# Patient Record
Sex: Female | Born: 1990 | Race: White | Hispanic: No | Marital: Single | Smoking: Never smoker
Health system: Southern US, Community
[De-identification: ages and names within clinical notes are randomized; demographics above are authoritative.]

## PROBLEM LIST (undated history)

## (undated) DIAGNOSIS — F909 Attention-deficit hyperactivity disorder, unspecified type: Secondary | ICD-10-CM

## (undated) DIAGNOSIS — F419 Anxiety disorder, unspecified: Secondary | ICD-10-CM

## (undated) DIAGNOSIS — F32A Depression, unspecified: Secondary | ICD-10-CM

## (undated) DIAGNOSIS — F319 Bipolar disorder, unspecified: Secondary | ICD-10-CM

## (undated) HISTORY — PX: DILATION AND CURETTAGE OF UTERUS: SHX78

---

## 2012-10-22 ENCOUNTER — Encounter (HOSPITAL_BASED_OUTPATIENT_CLINIC_OR_DEPARTMENT_OTHER): Payer: Self-pay | Admitting: *Deleted

## 2012-10-22 ENCOUNTER — Emergency Department (HOSPITAL_BASED_OUTPATIENT_CLINIC_OR_DEPARTMENT_OTHER)
Admission: EM | Admit: 2012-10-22 | Discharge: 2012-10-22 | Disposition: A | Discharge status: Home / Self Care | Payer: Medicaid Other | Attending: Emergency Medicine | Admitting: Emergency Medicine

## 2012-10-22 DIAGNOSIS — J039 Acute tonsillitis, unspecified: Secondary | ICD-10-CM | POA: Insufficient documentation

## 2012-10-22 DIAGNOSIS — Z3202 Encounter for pregnancy test, result negative: Secondary | ICD-10-CM | POA: Insufficient documentation

## 2012-10-22 LAB — URINALYSIS, ROUTINE W REFLEX MICROSCOPIC
Bilirubin Urine: NEGATIVE
Glucose, UA: NEGATIVE mg/dL
Hgb urine dipstick: NEGATIVE
Ketones, ur: NEGATIVE mg/dL
Protein, ur: NEGATIVE mg/dL
pH: 7.5 (ref 5.0–8.0)

## 2012-10-22 LAB — URINE MICROSCOPIC-ADD ON

## 2012-10-22 MED ORDER — HYDROCODONE-ACETAMINOPHEN 5-325 MG PO TABS: 2.0000 | ORAL_TABLET | ORAL | Status: DC | PRN

## 2012-10-22 MED ORDER — CLINDAMYCIN HCL 150 MG PO CAPS: 300.0000 mg | ORAL_CAPSULE | Freq: Four times a day (QID) | ORAL | Status: DC

## 2012-10-22 NOTE — ED Provider Notes (Signed)
Medical screening examination/treatment/procedure(s) were performed by non-physician practitioner and as supervising physician I was immediately available for consultation/collaboration.    Nelia Shi, MD 10/22/12 [phone]

## 2012-10-22 NOTE — ED Notes (Signed)
Encouraged Pt. She will be seen by a EDP soon.

## 2012-10-22 NOTE — ED Provider Notes (Signed)
History     CSN: [id number]  Arrival date & time 10/22/12  1416   First MD Initiated Contact with Patient 10/22/12 1531      Chief Complaint  Patient presents with  . Sore Throat    (Consider location/radiation/quality/duration/timing/severity/associated sxs/prior treatment) Patient is a 22 y.o. female presenting with pharyngitis. The history is provided by the patient. No language interpreter was used.  Sore Throat This is a new problem. The current episode started yesterday. The problem occurs constantly. The problem has been gradually worsening. Associated symptoms include a sore throat. Nothing aggravates the symptoms. The treatment provided moderate relief.   Pt reports she has had a cough for several weeks and now has a bad sore throat and an ear ache History reviewed. No pertinent past medical history.  History reviewed. No pertinent past surgical history.  No family history on file.  History  Substance Use Topics  . Smoking status: Never Smoker   . Smokeless tobacco: Not on file  . Alcohol Use: Yes    OB History    Grav Para Term Preterm Abortions TAB SAB Ect Mult Living                  Review of Systems  HENT: Positive for sore throat.   All other systems reviewed and are negative.    Allergies  Amoxicillin  Home Medications  No current outpatient prescriptions on file.  BP 111/69  Pulse 79  Temp 98.3 F (36.8 C) (Oral)  Resp 20  SpO2 100%  LMP 09/21/2012  Physical Exam  Nursing note and vitals reviewed. Constitutional: She appears well-developed and well-nourished.  HENT:  Head: Normocephalic and atraumatic.  Right Ear: External ear normal.       Exudate left tonsil  Swelling bilat  Eyes: Conjunctivae normal and EOM are normal. Pupils are equal, round, and reactive to light.  Neck: Normal range of motion. Neck supple.  Cardiovascular: Normal rate.   Pulmonary/Chest: Effort normal.  Abdominal: Soft.  Musculoskeletal: Normal range of  motion.  Neurological: She is alert.  Skin: Skin is warm.  Psychiatric: She has a normal mood and affect.    ED Course  Procedures (including critical care time)  Labs Reviewed  URINALYSIS, ROUTINE W REFLEX MICROSCOPIC - Abnormal; Notable for the following:    APPearance CLOUDY (*)     Leukocytes, UA MODERATE (*)     All other components within normal limits  URINE MICROSCOPIC-ADD ON - Abnormal; Notable for the following:    Squamous Epithelial / LPF FEW (*)     Bacteria, UA FEW (*)     All other components within normal limits  PREGNANCY, URINE  RAPID STREP SCREEN  URINE CULTURE   No results found.   1. Tonsillitis       MDM  Clindamycin and hydrocodone        Lonia Skinner [place], [place] 10/22/12 1618  Lonia Skinner [place], [place] 10/22/12 [phone]

## 2012-10-22 NOTE — ED Notes (Signed)
Sore throat and left ear pain since last pm.

## 2012-10-23 LAB — URINE CULTURE

## 2013-04-14 ENCOUNTER — Encounter (HOSPITAL_BASED_OUTPATIENT_CLINIC_OR_DEPARTMENT_OTHER): Payer: Self-pay | Admitting: Emergency Medicine

## 2013-04-14 ENCOUNTER — Emergency Department (HOSPITAL_BASED_OUTPATIENT_CLINIC_OR_DEPARTMENT_OTHER)
Admission: EM | Admit: 2013-04-14 | Discharge: 2013-04-14 | Disposition: A | Discharge status: Home Health | Payer: Medicaid Other | Attending: Emergency Medicine | Admitting: Emergency Medicine

## 2013-04-14 DIAGNOSIS — Y92009 Unspecified place in unspecified non-institutional (private) residence as the place of occurrence of the external cause: Secondary | ICD-10-CM | POA: Insufficient documentation

## 2013-04-14 DIAGNOSIS — Z880 Allergy status to penicillin: Secondary | ICD-10-CM | POA: Insufficient documentation

## 2013-04-14 DIAGNOSIS — T754XXA Electrocution, initial encounter: Secondary | ICD-10-CM | POA: Insufficient documentation

## 2013-04-14 DIAGNOSIS — W868XXA Exposure to other electric current, initial encounter: Secondary | ICD-10-CM | POA: Insufficient documentation

## 2013-04-14 DIAGNOSIS — IMO0002 Reserved for concepts with insufficient information to code with codable children: Secondary | ICD-10-CM

## 2013-04-14 DIAGNOSIS — Y939 Activity, unspecified: Secondary | ICD-10-CM | POA: Insufficient documentation

## 2013-04-14 MED ORDER — HYDROCODONE-ACETAMINOPHEN 5-325 MG PO TABS: 2.0000 | ORAL_TABLET | ORAL | Status: DC | PRN
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
## 2013-04-15 NOTE — ED Provider Notes (Signed)
Medical screening examination/treatment/procedure(s) were performed by non-physician practitioner and as supervising physician I was immediately available for consultation/collaboration.   Gwyneth Sprout, MD 04/15/13 [phone]

## 2014-06-03 ENCOUNTER — Emergency Department (HOSPITAL_BASED_OUTPATIENT_CLINIC_OR_DEPARTMENT_OTHER)
Admission: EM | Admit: 2014-06-03 | Discharge: 2014-06-03 | Disposition: A | Discharge status: Home / Self Care | Payer: Medicaid Other | Attending: Emergency Medicine | Admitting: Emergency Medicine

## 2014-06-03 DIAGNOSIS — Z3202 Encounter for pregnancy test, result negative: Secondary | ICD-10-CM | POA: Insufficient documentation

## 2014-06-03 DIAGNOSIS — Z880 Allergy status to penicillin: Secondary | ICD-10-CM | POA: Insufficient documentation

## 2014-06-03 DIAGNOSIS — R197 Diarrhea, unspecified: Secondary | ICD-10-CM | POA: Insufficient documentation

## 2014-06-03 DIAGNOSIS — Z9889 Other specified postprocedural states: Secondary | ICD-10-CM | POA: Insufficient documentation

## 2014-06-03 DIAGNOSIS — Z792 Long term (current) use of antibiotics: Secondary | ICD-10-CM | POA: Diagnosis not present

## 2014-06-03 DIAGNOSIS — R112 Nausea with vomiting, unspecified: Secondary | ICD-10-CM | POA: Diagnosis present

## 2014-06-03 DIAGNOSIS — Z79899 Other long term (current) drug therapy: Secondary | ICD-10-CM | POA: Insufficient documentation

## 2014-06-03 LAB — CBC
HCT: 37.7 % (ref 36.0–46.0)
HEMOGLOBIN: 12.9 g/dL (ref 12.0–15.0)
MCH: 30.6 pg (ref 26.0–34.0)
MCHC: 34.2 g/dL (ref 30.0–36.0)
MCV: 89.3 fL (ref 78.0–100.0)
PLATELETS: 231 10*3/uL (ref 150–400)
RBC: 4.22 MIL/uL (ref 3.87–5.11)
RDW: 13.6 % (ref 11.5–15.5)
WBC: 9.2 10*3/uL (ref 4.0–10.5)

## 2014-06-03 LAB — COMPREHENSIVE METABOLIC PANEL
ALT: 9 U/L (ref 0–35)
ANION GAP: 14 (ref 5–15)
AST: 11 U/L (ref 0–37)
Albumin: 3.9 g/dL (ref 3.5–5.2)
Alkaline Phosphatase: 35 U/L — ABNORMAL LOW (ref 39–117)
BILIRUBIN TOTAL: 0.4 mg/dL (ref 0.3–1.2)
BUN: 6 mg/dL (ref 6–23)
CHLORIDE: 104 meq/L (ref 96–112)
CO2: 25 meq/L (ref 19–32)
CREATININE: 0.7 mg/dL (ref 0.50–1.10)
Calcium: 9.8 mg/dL (ref 8.4–10.5)
Glucose, Bld: 101 mg/dL — ABNORMAL HIGH (ref 70–99)
Potassium: 3.3 mEq/L — ABNORMAL LOW (ref 3.7–5.3)
Sodium: 143 mEq/L (ref 137–147)
Total Protein: 7.3 g/dL (ref 6.0–8.3)

## 2014-06-03 LAB — URINALYSIS, ROUTINE W REFLEX MICROSCOPIC
BILIRUBIN URINE: NEGATIVE
Glucose, UA: NEGATIVE mg/dL
Hgb urine dipstick: NEGATIVE
KETONES UR: NEGATIVE mg/dL
Leukocytes, UA: NEGATIVE
NITRITE: NEGATIVE
PH: 7 (ref 5.0–8.0)
Protein, ur: NEGATIVE mg/dL
Specific Gravity, Urine: 1.012 (ref 1.005–1.030)
Urobilinogen, UA: 0.2 mg/dL (ref 0.0–1.0)

## 2014-06-03 LAB — LIPASE, BLOOD: Lipase: 32 U/L (ref 11–59)

## 2014-06-03 LAB — PREGNANCY, URINE: Preg Test, Ur: NEGATIVE

## 2014-06-03 MED ORDER — ONDANSETRON HCL 4 MG/2ML IJ SOLN
4.0000 mg | Freq: Once | INTRAMUSCULAR | Status: AC
  Administered 2014-06-03: 4 mg via INTRAVENOUS
  Filled 2014-06-03: qty 2

## 2014-06-03 MED ORDER — ONDANSETRON HCL 4 MG PO TABS: 4.0000 mg | ORAL_TABLET | Freq: Four times a day (QID) | ORAL | Status: DC

## 2014-06-03 MED ORDER — SODIUM CHLORIDE 0.9 % IV BOLUS (SEPSIS)
1000.0000 mL | Freq: Once | INTRAVENOUS | Status: AC
  Administered 2014-06-03: 1000 mL via INTRAVENOUS

## 2014-06-03 NOTE — ED Provider Notes (Signed)
CSN: [id number]     Arrival date & time 06/03/14  1014 History   First MD Initiated Contact with Patient 06/03/14 1037     No chief complaint on file.    (Consider location/radiation/quality/duration/timing/severity/associated sxs/prior Treatment) HPI Pt presenting with c/o nausea, vomiting, diarrhea which began several days ago, worse today.  She states that her primary concern is that she is afraid she is pregnant.  LMP was 2 weeks ago and normal.  No abdominal pain. No fever/chills.  She has no known sick contacts.  No recent travel.  She has tried drinking liquids but states this causes dry heaves.  There are no other associated systemic symptoms, there are no other alleviating or modifying factors.   No past medical history on file. Past Surgical History  Procedure Laterality Date  . Dilation and curettage of uterus     No family history on file. History  Substance Use Topics  . Smoking status: Never Smoker   . Smokeless tobacco: Not on file  . Alcohol Use: Yes   OB History   Grav Para Term Preterm Abortions TAB SAB Ect Mult Living                 Review of Systems ROS reviewed and all otherwise negative except for mentioned in HPI    Allergies  Amoxicillin  Home Medications   Prior to Admission medications   Medication Sig Start Date End Date Taking? Authorizing Provider  clindamycin (CLEOCIN) 150 MG capsule Take 2 capsules (300 mg total) by mouth every 6 (six) hours. 10/22/12   Elson Areas, PA-C  HYDROcodone-acetaminophen (NORCO/VICODIN) 5-325 MG per tablet Take 2 tablets by mouth every 4 (four) hours as needed for pain. 10/22/12   Elson Areas, PA-C  HYDROcodone-acetaminophen (NORCO/VICODIN) 5-325 MG per tablet Take 2 tablets by mouth every 4 (four) hours as needed for pain. 04/14/13   Elson Areas, PA-C  ondansetron (ZOFRAN) 4 MG tablet Take 1 tablet (4 mg total) by mouth every 6 (six) hours. 06/03/14   Ethelda Chick, MD   BP 106/70  Pulse 52  Temp(Src) 97.7  F (36.5 C) (Oral)  Resp 17  Ht  (1.753 m)  Wt 200 lb (90.719 kg)  BMI 29.52 kg/m2  SpO2 100% Vitals reviewed Physical Exam Physical Examination: General appearance - alert, well appearing, and in no distress Mental status - alert, oriented to person, place, and time Eyes - no scleral icterus, no conjunctival injection Mouth - mucous membranes moist, pharynx normal without lesions Chest - clear to auscultation, no wheezes, rales or rhonchi, symmetric air entry Heart - normal rate, regular rhythm, normal S1, S2, no murmurs, rubs, clicks or gallops Abdomen - soft, nontender, nondistended, no masses or organomegaly, nabs Extremities - peripheral pulses normal, no pedal edema, no clubbing or cyanosis Skin - normal coloration and turgor, no rashes  ED Course  Procedures (including critical care time)  12:54 PM pt has been able to keep down liquids in the ED.  Labs Review Labs Reviewed  COMPREHENSIVE METABOLIC PANEL - Abnormal; Notable for the following:    Potassium 3.3 (*)    Glucose, Bld 101 (*)    Alkaline Phosphatase 35 (*)    All other components within normal limits  URINALYSIS, ROUTINE W REFLEX MICROSCOPIC  PREGNANCY, URINE  CBC  LIPASE, BLOOD    Imaging Review No results found.   EKG Interpretation None      MDM   Final diagnoses:  Nausea vomiting and  diarrhea    Pt presenting with c/o nasuea, vomiting, diarrhea. UA reassuring. Pt feels improved after IV fluids and zofran.  She has tolerated po trial. Discharged with strict return precautions.  Pt agreeable with plan.    Ethelda Chick, MD 06/05/14 2052

## 2014-06-03 NOTE — Discharge Instructions (Signed)
Return to the ED with any concerns including vomiting and not able to keep down liquids, abdominal pain- especially if it localizes to the right lower abdomen, fever/chills, decreased level of alertness/lethargy, or any other alarming symptoms

## 2014-06-03 NOTE — ED Notes (Signed)
Drinking sprite w/o nausea

## 2014-07-21 ENCOUNTER — Emergency Department (HOSPITAL_BASED_OUTPATIENT_CLINIC_OR_DEPARTMENT_OTHER)
Admission: EM | Admit: 2014-07-21 | Discharge: 2014-07-21 | Disposition: A | Discharge status: Home / Self Care | Payer: Medicaid Other | Attending: Emergency Medicine | Admitting: Emergency Medicine

## 2014-07-21 ENCOUNTER — Encounter (HOSPITAL_BASED_OUTPATIENT_CLINIC_OR_DEPARTMENT_OTHER): Payer: Self-pay | Admitting: Emergency Medicine

## 2014-07-21 DIAGNOSIS — K0889 Other specified disorders of teeth and supporting structures: Secondary | ICD-10-CM

## 2014-07-21 DIAGNOSIS — Z3202 Encounter for pregnancy test, result negative: Secondary | ICD-10-CM | POA: Diagnosis not present

## 2014-07-21 DIAGNOSIS — K0381 Cracked tooth: Secondary | ICD-10-CM | POA: Insufficient documentation

## 2014-07-21 DIAGNOSIS — Z880 Allergy status to penicillin: Secondary | ICD-10-CM | POA: Insufficient documentation

## 2014-07-21 DIAGNOSIS — K088 Other specified disorders of teeth and supporting structures: Secondary | ICD-10-CM | POA: Insufficient documentation

## 2014-07-21 LAB — PREGNANCY, URINE: PREG TEST UR: NEGATIVE

## 2014-07-21 MED ORDER — HYDROCODONE-ACETAMINOPHEN 5-325 MG PO TABS: 1.0000 | ORAL_TABLET | ORAL | Status: DC | PRN

## 2014-07-21 MED ORDER — CLINDAMYCIN HCL 150 MG PO CAPS: 450.0000 mg | ORAL_CAPSULE | Freq: Three times a day (TID) | ORAL | Status: AC

## 2014-07-21 NOTE — ED Provider Notes (Signed)
CSN: [id number]     Arrival date & time 07/21/14  1749 History   First MD Initiated Contact with Patient 07/21/14 1816     Chief Complaint  Patient presents with  . Dental Pain   (Consider location/radiation/quality/duration/timing/severity/associated sxs/prior Treatment) HPI Christina Arnold is a 23 yo female presenting with tooth pain x 1 week.  She has a tooth on her right lower jaw that recently broke that she reports is painful.  She reports 10/10 pain.  Pt also states she would also like someone to be able to prescribe medicines for her moods because sometime she loses her temper and gets angry at people.  She denies being angry currently and denies any thoughts of hurting herself or others.  She also reports she doe not know when her LMP was but states it is late.  She denies fevers, nausea, vomiting or headaches.       History reviewed. No pertinent past medical history. Past Surgical History  Procedure Laterality Date  . Dilation and curettage of uterus     History reviewed. No pertinent family history. History  Substance Use Topics  . Smoking status: Never Smoker   . Smokeless tobacco: Not on file  . Alcohol Use: Yes   OB History   Grav Para Term Preterm Abortions TAB SAB Ect Mult Living                 Review of Systems  Constitutional: Negative for fever and chills.  HENT: Negative for sore throat.   Eyes: Negative for visual disturbance.  Respiratory: Negative for cough and shortness of breath.   Cardiovascular: Negative for chest pain and leg swelling.  Gastrointestinal: Negative for nausea, vomiting and diarrhea.  Genitourinary: Negative for dysuria.  Musculoskeletal: Negative for myalgias.  Skin: Negative for rash.  Neurological: Negative for weakness, numbness and headaches.     Allergies  Amoxicillin  Home Medications   Prior to Admission medications   Medication Sig Start Date End Date Taking? Authorizing Provider  clindamycin (CLEOCIN) 150 MG  capsule Take 2 capsules (300 mg total) by mouth every 6 (six) hours. 10/22/12   Elson AreasLeslie K Sofia, PA-C  HYDROcodone-acetaminophen (NORCO/VICODIN) 5-325 MG per tablet Take 2 tablets by mouth every 4 (four) hours as needed for pain. 10/22/12   Elson AreasLeslie K Sofia, PA-C  HYDROcodone-acetaminophen (NORCO/VICODIN) 5-325 MG per tablet Take 2 tablets by mouth every 4 (four) hours as needed for pain. 04/14/13   Elson AreasLeslie K Sofia, PA-C  ondansetron (ZOFRAN) 4 MG tablet Take 1 tablet (4 mg total) by mouth every 6 (six) hours. 06/03/14   Ethelda ChickMartha K Linker, MD   BP 110/58  Pulse 87  Temp(Src) 98.9 F (37.2 C) (Oral)  Resp 18  Ht 5\' 9"  (1.753 m)  Wt 195 lb (88.451 kg)  BMI 28.78 kg/m2  SpO2 100%  LMP 07/21/2014 Physical Exam  Nursing note and vitals reviewed. Constitutional: She appears well-developed and well-nourished. No distress.  HENT:  Head: Normocephalic and atraumatic.  Mouth/Throat: Oropharynx is clear and moist. No oropharyngeal exudate.  Eyes: Conjunctivae are normal.  Neck: Neck supple. No thyromegaly present.  Cardiovascular: Normal rate, regular rhythm and intact distal pulses.   Pulmonary/Chest: Effort normal and breath sounds normal. No respiratory distress. She has no wheezes. She has no rales. She exhibits no tenderness.  Abdominal: Soft. There is no tenderness.  Musculoskeletal: She exhibits no tenderness.  Lymphadenopathy:    She has no cervical adenopathy.  Neurological: She is alert.  Skin: Skin is warm  and dry. No rash noted. She is not diaphoretic.  Psychiatric: She has a normal mood and affect.    ED Course  Procedures (including critical care time) Labs Review Labs Reviewed - No data to display  Imaging Review No results found.   EKG Interpretation None      MDM   Final diagnoses:  Pain, dental   23 yo female  with toothache.  No gross abscess.  Exam unconcerning for Ludwig's angina or spread of infection.  Will treat with clindamycin and pain medicine.  Her urine is  negative for pregnancy and she has been given resources for establishing care with a PCP for eval of her moods and recommended patient to follow-up with dentist.    Ceasar MonsFiled Vitals:   07/21/14 1817  BP: 110/58  Pulse: 87  Temp: 98.9 F (37.2 C)  TempSrc: Oral  Resp: 18  Height: 5\' 9"  (1.753 m)  Weight: 195 lb (88.451 kg)  SpO2: 100%   Meds given in ED:  Medications - No data to display  Discharge Medication List as of 07/21/2014  7:02 PM    START taking these medications   Details  !! clindamycin (CLEOCIN) 150 MG capsule Take 3 capsules (450 mg total) by mouth 3 (three) times daily., Starting 07/21/2014, Last dose on Fri 07/31/14, Print    !! HYDROcodone-acetaminophen (NORCO/VICODIN) 5-325 MG per tablet Take 1-2 tablets by mouth every 4 (four) hours as needed for moderate pain or severe pain., Starting 07/21/2014, Until Discontinued, Print     !! - Potential duplicate medications found. Please discuss with provider.        Harle BattiestElizabeth Itzamar Traynor, NP 07/25/14 1912

## 2014-07-21 NOTE — ED Notes (Addendum)
Pt c/o dental pain x 1 week pt also reports LMP late ? preg

## 2014-07-21 NOTE — Discharge Instructions (Signed)
Please follow the directions provided. Be sure to establish care with a dentist provided in the resource guide. You may take the antibiotic as prescribed. Referral is provided for primary care physician to help manage concerns regarding her moods. You may take ibuprofen 400 mg by mouth every 6 hours for pain or a Vicodin tablet for pain not relieved by the ibuprofen. Don't hesitate to return for new, worsening, or concerning symptoms.  SEEK IMMEDIATE MEDICAL CARE IF:  You have a fever.  You develop redness and swelling of your face, jaw, or neck.  You are unable to open your mouth.  You have severe pain uncontrolled by pain medicine   Emergency Department Resource Guide 1) Find a Doctor and Pay Out of Pocket Although you won't have to find out who is covered by your insurance plan, it is a good idea to ask around and get recommendations. You will then need to call the office and see if the doctor you have chosen will accept you as a new patient and what types of options they offer for patients who are self-pay. Some doctors offer discounts or will set up payment plans for their patients who do not have insurance, but you will need to ask so you aren't surprised when you get to your appointment.  2) Contact Your Local Health Department Not all health departments have doctors that can see patients for sick visits, but many do, so it is worth a call to see if yours does. If you don't know where your local health department is, you can check in your phone book. The CDC also has a tool to help you locate your state's health department, and many state websites also have listings of all of their local health departments.  3) Find a Walk-in Clinic If your illness is not likely to be very severe or complicated, you may want to try a walk in clinic. These are popping up all over the country in pharmacies, drugstores, and shopping centers. They're usually staffed by nurse practitioners or physician assistants  that have been trained to treat common illnesses and complaints. They're usually fairly quick and inexpensive. However, if you have serious medical issues or chronic medical problems, these are probably not your best option.  No Primary Care Doctor: - Call Health Connect at  [phone] - they can help you locate a primary care doctor that  accepts your insurance, provides certain services, etc. - Physician Referral Service- [phone]  Chronic Pain Problems: Organization         Address  Phone   Notes  [facility]  [phone] Patients need to be referred by their primary care doctor.   Medication Assistance: Organization         Address  Phone   Notes  [facility] [address] [place], [place] [address] [phone] --Must be a resident of [facility] -- Must have NO insurance coverage whatsoever (no Medicaid/ Medicare, etc.) -- The pt. MUST have a primary care doctor that directs their care regularly and follows them in the community   [facility]  [phone]   [employer]  [phone]    Agencies that provide inexpensive medical care: Organization         Address  Phone   Notes  [facility]  [phone]   [facility]    [phone]   [facility] [address]  [place], [place] [address] [phone]   [facility] [address], [place] [phone]) [phone]   Planned Parenthood    [phone]) [phone]   [facility]    [phone]   [facility] and [facility]  [address], [place] Phone:  [phone](336) [phone], Fax:  [phone](336) [phone] Hours of Operation:  9 am - 6 pm, M-F.  Also accepts Medicaid/Medicare and self-pay.  [facility]  [address], [place] Phone: [phone]) [phone], Fax: [phone]) [phone]. Hours of Operation:  8:30 am - 5:30 pm, M-F.  Also accepts Medicaid  and self-pay.  [facility] [place] [address], [place] Phone: [phone](336) [phone]   [facility] [address], [place], [place] [phone], Ext. [phone] Mondays & Thursdays: 7-9 AM.  First 15 patients are seen on a first come, first serve basis.    Medicaid-accepting [facility] Providers:  Organization         Address  Phone   Notes  [facility] [address], [place] [phone]) [phone] Also accepts self-pay patients.  [facility] [address], [place]  [phone]) [phone]   [facility] [address], [address], [place] [phone]) [phone]   [facility] [address] [place] [address], [place] [phone]) [phone]   Renaye RakersVeita Bland [address], [place]   [phone](336) [phone] Only accepts [place] Access [place]icaid patients after they have their name applied to their card.   Self-Pay (no insurance) in [facility]:  Organization         Address  Phone   Notes  Sickle Cell Patients, [facility] [address], [place] [phone]) [phone]   [facility] [address], [place] [phone]) [phone]   [facility]  [address], [place] [phone]) [phone]   [facility]/Dr. Osei-Bonsu  [address] [place] [address], [place] or [id number], [place] [phone]) [phone] Phone number for both [place] and [place] locations is the same.  [facility] [address], [place] [phone]) [phone]   [facility] [address] [place] [address], [place] or [address] [phone]) [phone]   [facility] [address], [place] [phone]) [phone], phone; [phone]) [phone], fax Sees patients 1st and 3rd Saturday of every month.  Must not qualify for public or private insurance (i.e. Medicaid, Medicare, Day Health Choice, Veterans' Benefits)  Household income  should be no more than 200% of the poverty level The clinic cannot treat you if you are pregnant or think you are pregnant  Sexually transmitted diseases are not treated at the clinic.    Dental Care: Organization         Address  Phone  Notes  [facility] of [facility] [address], [place] [phone]) [phone] Accepts children up to age 25 who are enrolled in [place]icaid or [place] Health Choice; pregnant women with a Medicaid card; and children who have applied for Medicaid or [place] Health Choice, but were declined, whose parents can pay a reduced fee at time of service.  [facility] of [facility] [place]  [address], [place] [phone]) [phone] Accepts children up to age 25 who are enrolled in [place]icaid or [place] Health Choice;  pregnant women with a Medicaid card; and children who have applied for Medicaid or [place] Health Choice, but were declined, whose parents can pay a reduced fee at time of service.  [facility] Access PRO[facility]  [place] [phone] Patients are seen by appointment only. Walk-ins are not accepted. [place] will see patients 15 years of age and older. Monday - Tuesday (8am-5pm) Most Wednesdays (8:30-5pm) $30 per visit, cash only  [facility] Adult Dental Access PROGRAM  [address], [facility] [phone] Patients are seen by appointment only. Walk-ins are not accepted. Welcome will see patients 36 years of age and older. One Wednesday Evening (Monthly: Volunteer Based).  $30 per visit, cash only  [place]  [phone] for adults; Children under age 94, call Graduate Pediatric Dentistry at [phone]. Children aged 40-14, please call [phone] to request a pediatric application.  Dental services are provided in all areas of dental care including fillings, crowns and bridges, complete and partial dentures, implants, gum treatment,  root canals, and extractions. Preventive care is also provided. Treatment is provided to both adults and children. Patients are selected via a lottery and there is often a waiting list.   [facility] [address], [place]  [phone] [email or web address]   [facility] [address] [place], [place] [phone], Ext. [phone] Second and Fourth Thursday of each month, opens at 6:30 AM; Clinic ends at 9 AM.  Patients are seen on a first-come first-served basis, and a limited number are seen during each clinic.   [facility]  [address] Hillard Danker [place], [place] [phone]   Eligibility Requirements You must have lived in [place], [place], or [place] counties for at least the last three months.   You cannot be eligible for state or federal sponsored [employer], including [employer], [place], or [employer].   You generally cannot be eligible for healthcare insurance through your employer.    How to apply: Eligibility screenings are held every Tuesday and Wednesday afternoon from 1:00 pm until 4:00 pm. You do not need an appointment for the interview!  [facility] [address], [place], [place]   [place]  [place]  [place]  [phone]    Behavioral Health Resources in the Community: Intensive Outpatient Programs Organization         Address  Phone  Notes  [place]. [address], [place], [place] [phone]   [facility] [address], [place], [place]   ADS: Alcohol & Drug S[facility]cs [address], [place], [place]   [place] [address]  [place], [place] or [phone]   Substance Abuse Resources Organization         Address  Phone  Notes  Alcohol [facility] Drug Services   [phone]   [place]  [phone]   The [place]   Chinita Pester  [phone]   Residential & Outpatient Substance Abuse Program  [phone]   Psychological Services Organization         Address  Phone  Notes  [facility]  [place]  [phone]   [facility] [address], [place] or [phone]    Mobile  Crisis Teams Organization         Address  Phone  Notes  [facility], Mobile Crisis Care Unit  [phone]   [facility]  [address] [place], [place] [phone]   [facility] [address] [place] [phone]    Self-Help/Support Groups Organization         Address  Phone             Notes  [facility]ental Health Assoc. of [place] - variety of support groups  336- [phone] Call for more information  Narcotics Anonymous (NA), [facility] [address], [place]  2 meetings at this location   Statisticianesidential Treatment Programs Organization         Address  Phone  Notes  [facility] [address],    [place]  [phone]   [facility]  [address], [place] [phone], [place], [place] [phone]   [facility] [address], [place] [phone] Admissions: 8am-3pm M-F  [facility] [address] [place] [address]    [place], [place] [phone]   [facility] [address], [place], [place] [phone]   [facility] [address]  [place], [place] [phone]   [facility] - Intensive Outpatient [address], [place], [place] [phone]   [facility] [address]  [place], [place] [phone] or [phone]   [facility] (RT[facility]) [address] [place], [place] [phone] Accepts Medicaid  [facility] [address]  [place] [phone] Substance Abuse/Addiction Treatment   [facility] Behavioral Health Resources Organization         Address  Phone  Notes  [facility]  [phone]) [phone]   Angie FavaJulie Brannon, PhD [address], [address] [place], [place]   [phone](336) [phone] or [phone](336) [phone]   [facility]   [address] [place], [place] [phone](336) [phone]   [facility] [address], [place], [place] [phone](336) [phone] Insurance/Medicaid/sponsorship through [facility]  [facility] Families [address]                                    [place], [place] [phone]) [phone] Therapy/tele-psych/case  [facility] [address]   [place], [place] [phone](336) [phone]    Dr. Lolly MustacheArfeen  [phone](336) [phone]   [facility] [place]  [employer] [facility] 1) [address], [place] 2) [address], [place] 3)  [address], [place] [phone](336) [phone](336) [phone]  [phone](336) [phone]   [facility] Child Abuse Hotline [phone]) [phone] or [phone](336) [phone] (After Hours)

## 2014-07-25 NOTE — ED Provider Notes (Signed)
Medical screening examination/treatment/procedure(s) were performed by non-physician practitioner and as supervising physician I was immediately available for consultation/collaboration.   EKG Interpretation None        Detrell Umscheid, MD 07/25/14 2335 

## 2014-08-07 ENCOUNTER — Encounter (HOSPITAL_BASED_OUTPATIENT_CLINIC_OR_DEPARTMENT_OTHER): Payer: Self-pay | Admitting: *Deleted

## 2014-08-07 ENCOUNTER — Emergency Department (HOSPITAL_BASED_OUTPATIENT_CLINIC_OR_DEPARTMENT_OTHER)
Admission: EM | Admit: 2014-08-07 | Discharge: 2014-08-07 | Disposition: A | Discharge status: Home / Self Care | Payer: Medicaid Other | Attending: Emergency Medicine | Admitting: Emergency Medicine

## 2014-08-07 DIAGNOSIS — Z79891 Long term (current) use of opiate analgesic: Secondary | ICD-10-CM | POA: Insufficient documentation

## 2014-08-07 DIAGNOSIS — M545 Low back pain, unspecified: Secondary | ICD-10-CM

## 2014-08-07 DIAGNOSIS — K1379 Other lesions of oral mucosa: Secondary | ICD-10-CM | POA: Insufficient documentation

## 2014-08-07 DIAGNOSIS — Z79899 Other long term (current) drug therapy: Secondary | ICD-10-CM | POA: Diagnosis not present

## 2014-08-07 DIAGNOSIS — Z880 Allergy status to penicillin: Secondary | ICD-10-CM | POA: Insufficient documentation

## 2014-08-07 DIAGNOSIS — Z3202 Encounter for pregnancy test, result negative: Secondary | ICD-10-CM | POA: Insufficient documentation

## 2014-08-07 DIAGNOSIS — R509 Fever, unspecified: Secondary | ICD-10-CM | POA: Diagnosis not present

## 2014-08-07 DIAGNOSIS — Z792 Long term (current) use of antibiotics: Secondary | ICD-10-CM | POA: Insufficient documentation

## 2014-08-07 DIAGNOSIS — K029 Dental caries, unspecified: Secondary | ICD-10-CM | POA: Diagnosis not present

## 2014-08-07 DIAGNOSIS — R002 Palpitations: Secondary | ICD-10-CM

## 2014-08-07 DIAGNOSIS — R220 Localized swelling, mass and lump, head: Secondary | ICD-10-CM

## 2014-08-07 LAB — URINALYSIS, ROUTINE W REFLEX MICROSCOPIC
BILIRUBIN URINE: NEGATIVE
Glucose, UA: NEGATIVE mg/dL
Hgb urine dipstick: NEGATIVE
KETONES UR: NEGATIVE mg/dL
Leukocytes, UA: NEGATIVE
NITRITE: NEGATIVE
Protein, ur: NEGATIVE mg/dL
Specific Gravity, Urine: 1.021 (ref 1.005–1.030)
UROBILINOGEN UA: 0.2 mg/dL (ref 0.0–1.0)
pH: 8.5 — ABNORMAL HIGH (ref 5.0–8.0)

## 2014-08-07 LAB — PREGNANCY, URINE: Preg Test, Ur: NEGATIVE

## 2014-08-07 MED ORDER — IBUPROFEN 800 MG PO TABS
800.0000 mg | ORAL_TABLET | Freq: Once | ORAL | Status: AC
  Administered 2014-08-07: 800 mg via ORAL
  Filled 2014-08-07: qty 1

## 2014-08-07 NOTE — Discharge Instructions (Signed)
Emergency Department Resource Guide 1) Find a Doctor and Pay Out of Pocket Although you won't have to find out who is covered by your insurance plan, it is a good idea to ask around and get recommendations. You will then need to call the office and see if the doctor you have chosen will accept you as a new patient and what types of options they offer for patients who are self-pay. Some doctors offer discounts or will set up payment plans for their patients who do not have insurance, but you will need to ask so you aren't surprised when you get to your appointment.  2) Contact Your Local Health Department Not all health departments have doctors that can see patients for sick visits, but many do, so it is worth a call to see if yours does. If you don't know where your local health department is, you can check in your phone book. The CDC also has a tool to help you locate your state's health department, and many state websites also have listings of all of their local health departments.  3) Find a [facility] If your illness is not likely to be very severe or complicated, you may want to try a walk in clinic. These are popping up all over the country in pharmacies, drugstores, and shopping centers. They're usually staffed by nurse practitioners or physician assistants that have been trained to treat common illnesses and complaints. They're usually fairly quick and inexpensive. However, if you have serious medical issues or chronic medical problems, these are probably not your best option.  No Primary Care Doctor: - Call Health Connect at  [phone] - they can help you locate a primary care doctor that  accepts your insurance, provides certain services, etc. - Physician Referral Service- [phone]  Chronic Pain Problems: Organization         Address  Phone   Notes  [facility]  [phone] Patients need to be referred by their primary care doctor.   Medication  Assistance: Organization         Address  Phone   Notes  [facility] Medication [facility] [address] [place], [place] [address] [phone] --Must be a resident of [place] [facility] -- Must have NO insurance coverage whatsoever (no Medicaid/ Medicare, etc.) -- The pt. MUST have a primary care doctor that directs their care regularly and follows them in the community   [facility]  [phone]   [employer]  [phone]    Agencies that provide inexpensive medical care: Organization         Address  Phone   Notes  [place]  [phone]   [facility]    [phone]   [facility] [place], [place] [address] [phone]   [place] [address] [place] [phone]   Planned Parenthood    [phone]   [facility]    [phone]   [place] [address], [place] Phone:  [phone], Fax:  [phone] Hours of Operation:  9 am - 6 pm, M-F.  Also accepts Medicaid/Medicare and self-pay.  [facility] for [place], [address],  Phone: [phone], Fax: [phone]. Hours of Operation:  8:30 am - 5:30 pm, M-F.  Also accepts Medicaid and self-pay.  [facility] [address], [place] Phone: [phone]   [place], [place], [place] [phone], Ext. [phone] Mondays & Thursdays: 7-9 AM.  First 15 patients are seen on a first come, first serve basis.    [place] Providers:  Organization         Address  Phone   Notes  [facility] [address], [place] [phone] Also accepts self-pay patients.  [facility] [address], [place]  [phone]   [place], [address], [place]  [phone]   [facility] [address], [place] [phone]   Lucianne Lei [address], [place]   [phone] Only accepts [place] Access [place] patients after they have their name applied to their card.   Self-Pay (no insurance) in [facility]:  Organization         Address  Phone   Notes  Sickle Cell Patients, [facility] [place] [phone]   [facility] [place] [phone]   [facility] [place]  [place], [place], [place] [phone]   [facility]/Dr. Osei-Bonsu  [address] [place] or [address], [place] [phone] Phone number for both [place] and [place] locations is the same.  [facility] [address] [place] [phone]   [facility] [address] [place] or [address] [phone]   [facility] [address], [place] [phone], phone; [phone], fax Sees patients 1st and 3rd Saturday of every month.  Must not qualify for public or private insurance (i.e. Medicaid, Medicare, [place] Health Choice, Veterans' Benefits)  Household income should be no more than 200% of the poverty level The clinic cannot treat you if you are pregnant or think you are pregnant  Sexually transmitted diseases are not treated at the clinic.    Dental Care: Organization         Address  Phone  Notes  [place] [facility] [place] [phone] Accepts children up to age 4 who are enrolled in [place] or [place]; pregnant women with a Medicaid card; and children who have applied for Medicaid or [place] Health Choice, but were declined, whose parents can pay a reduced fee at time of service.  [facility]  [facility]  [address], [place] [phone] Accepts children up to age 35 who are enrolled in [place] or [place]; pregnant women with a Medicaid card; and children who have applied for Medicaid or [place] Health Choice, but were declined, whose parents can pay a reduced fee at time of service.  [facility]  [place] [phone] Patients are seen by appointment only. Walk-ins are not accepted. [place] will see patients 43 years of age and older. Monday - Tuesday (8am-5pm) Most Wednesdays (8:30-5pm) $30 per visit, cash only  [facility] Adult [facility] Access PROGRAM  [address], [facility] [phone] Patients are seen by appointment only. Walk-ins are not accepted. [place] will see patients 40 years of age and older. One  Wednesday Evening (Monthly: Volunteer Based).  $30 per visit, cash only  °[facility]  [phone] for adults; Children under age 4, call Graduate Pediatric Dentistry at [phone]. Children aged 4-14, please call [phone] to request a pediatric application. ° Dental services are provided in all areas of dental care including fillings, crowns and bridges, complete and partial dentures, implants, gum treatment, root canals, and extractions. Preventive care is also provided. Treatment is provided to both adults and children. °Patients are selected via a lottery and there is often a waiting list. °  °[facility] [address], °[place] ° ([phone] [email or web address] °  °[facility] [address], [place], [place] [phone], Ext. [phone] Second and Fourth Thursday of each month, opens at 6:30 AM; Clinic ends at 9 AM.  Patients are seen on a first-come first-served basis, and a limited number are seen during each clinic.  ° °[facility] ° [address], [place], [place] [phone]    Eligibility Requirements °You must have lived in [place], [place], or [place] counties for at least the last three months. °  You cannot be eligible for state or federal sponsored healthcare insurance, including Veterans Administration, Medicaid, or Medicare. °  You generally cannot be eligible for healthcare insurance through your employer.  °  How to apply: °Eligibility screenings are held every Tuesday and Wednesday afternoon from 1:00 pm until 4:00 pm. You do not need an appointment for the interview!  °[facility] [address], [place], [place] [phone]   °[facility]  [phone]   °[facility]  [phone]   °[facility]  336-570-6415   ° °Behavioral Health Resources in the Community: °Intensive Outpatient Programs °Organization         Address  Phone  Notes  °[facility] [address], [place], [place] [phone]   °[facility] [address], [place], [place] [phone]   °[facility] [address], [place], [place] ° 336-882-2125   °[facility] [address],  °[place], [place] 1-800-853-5163 or 336-641-4981   °Substance Abuse Resources °Organization         Address  Phone  Notes  °[facility]  336-882-2125   °[facility]  336-784-9470   °[facility]  336-285-9073   °[facility]  336-845-3988   °Residential & Outpatient Substance Abuse Program  1-800-659-3381   °Psychological Services °Organization         Address  Phone  Notes  °[facility]  336- [phone]   °[facility]  336- 378-7881   °[facility] [address], [place] 1-800-853-5163 or 336-641-4981   ° °Mobile Crisis Teams °Organization         Address  Phone  Notes  °[facility], Mobile Crisis Care Unit  1-877-626-1772   °Assertive °[facility] ° [address]  [place], [place] 336-834-9664   °Sharon DeEsch [address] °[place] 336-554-5454   ° °Self-Help/Support Groups °Organization         Address  Phone             Notes  °[facility]oc[facility] of  - variety of support groups  336- 373-1402 Call for more information  °Narcotics Anonymous (NA), [facility] [address], °[place]  2 meetings at this location  ° °  Residential Treatment Programs Organization         Address  Phone  Notes  [facility] [address],    [place]  1-716-762-7806   [facility]  [address], [place] [address], [place], [place]   [place], [place] [phone] Admissions: 8am-3pm M-F  [facility]stance [place] [address]    [place], [place] [phone]   [facility] [address] [place], [place], [place]   [facility] [address]  [place], [place]   [facility] - Intensive Outpatient [address], [place], [place]   [facility] ([place].,  [place], [place] 1-786-149-3311 or [phone]   [facility] ([facility]) [address], [place], [place] Accepts Medicaid  [facility] [place] [address],  [place] 1-(732)[phone] Substance Abuse/Addiction Treatment   [facility] Organization         Address  Phone  Notes  [facility]  443-042-1541   Domenic Schwab, PhD [address] [place], [place]   [phone] or [phone]   [place] [employer], [place] [phone]   [facility] [address], [place], [place] [phone] Insurance/Medicaid/sponsorship through [facility] Families [address], [address] [place]                                    [place], [place] [phone] [place] [address], [place] [phone]    Dr. Adele Schilder  [phone]   [facility] [place] Dept. 1) [address] [place] 2) [place] 3)  [address], [place] [phone]  [phone]   [place] [phone] or [phone] (After Hours)        Palpitations A palpitation is the feeling that your heartbeat is irregular. It may feel like your heart is fluttering or skipping a beat. It may also feel like your heart is beating faster than normal. This is usually not a serious problem. In some cases, you may need more medical tests. HOME CARE  Avoid:  Caffeine in coffee, tea, soft drinks, diet pills, and energy drinks.  Chocolate.  Alcohol.  Stop smoking if you smoke.  Reduce your stress and anxiety. Try:  A method that measures bodily functions so you can learn to control them (biofeedback).  Yoga.  Meditation.  Physical activity such as swimming, jogging, or walking.  Get plenty of rest and sleep. GET HELP IF:  Your fast or irregular heartbeat continues after 24 hours.  Your palpitations occur more often. GET HELP RIGHT AWAY IF:   You have chest pain.  You feel short of breath.  You have a very bad headache.  You feel dizzy or pass out (faint). MAKE SURE YOU:   Understand these instructions.  Will watch your condition.  Will get help right away if you are not doing well or get worse. Document Released: 06/27/2008 Document Revised: 02/02/2014 Document Reviewed: 11/17/2011 [facility] Patient Information 2015 [place], [place]. This information is not intended to replace advice given to you by your health care provider. Make  sure you discuss any questions you have with your health care provider. Back Injury Prevention The following tips can help you to prevent a back injury. PHYSICAL FITNESS  Exercise often.  Try to develop strong stomach (abdominal) muscles.  Do aerobic exercises often. This includes walking, jogging, biking, swimming.  Do exercises that help with balance and strength often. This includes tai chi and yoga.  Stretch before and after you exercise.  Keep a healthy weight. DIET   Ask your doctor how much calcium and vitamin D you need every day.  Include calcium in your diet. Foods high in calcium include dairy products; green, leafy vegetables; and products with calcium added (fortified).  Include vitamin D in your diet. Foods high in vitamin D include milk and products with vitamin D added.  Think about taking a multivitamin or other nutritional products called " supplements."  Stop smoking if you smoke. POSTURE   Sit and stand up straight. Avoid leaning forward or hunching over.  Choose chairs that support your lower back.  If you work at a desk:  Sit close to your work so you do not lean over.  Keep your chin tucked in.  Keep your neck drawn back.  Keep your elbows bent at a right angle. Your arms should look like the letter "L."  Sit high and close to the steering wheel when you drive. Add low back support to your car seat if needed.  Avoid sitting or standing in one position for too long. Get up and move around every hour. Take breaks if you are driving for a long time.  Sleep on your side with your knees slightly bent. You can also sleep on your back with a pillow under your knees. Do not sleep on your stomach. LIFTING, TWISTING, AND REACHING  Avoid heavy lifting, especially lifting over and over again. If you must do heavy lifting:  Stretch before lifting.  Work slowly.  Rest between lifts.  Use carts and dollies to move objects when possible.  Make several small trips instead of carrying 1 heavy load.  Ask for help when you need it.  Ask for help when moving big, awkward objects.  Follow these steps when lifting:  Stand with your feet  shoulder-width apart.  Get as close to the object as you can. Do not pick up heavy objects that are far from your body.  Use handles or lifting straps when possible.  Bend at your knees. Squat down, but keep your heels off the floor.  Keep your shoulders back, your chin tucked in, and your back straight.  Lift the object slowly. Tighten the muscles in your legs, stomach, and butt. Keep the object as close to the center of your body as possible.  Reverse these directions when you put a load down.  Do not:  Lift the object above your waist.  Twist at the waist while lifting or carrying a load. Move your feet if you need to turn, not your waist.  Bend over without bending at your knees.  Avoid reaching over your head, across a table, or for an object on a high surface. OTHER TIPS  Avoid wet floors and keep sidewalks clear of ice.  Do not sleep on a mattress that is too soft or too hard.  Keep items that you use often within easy reach.  Put heavier objects on shelves at waist level. Put lighter objects on lower or higher shelves.  Find ways to lessen your stress. You can  try exercise, massage, or relaxation.  Get help for depression or anxiety if needed. GET HELP IF:  You injure your back.  You have questions about diet, exercise, or other ways to prevent back injuries. MAKE SURE YOU:  Understand these instructions.  Will watch your condition.  Will get help right away if you are not doing well or get worse. Document Released: 03/06/2008 Document Revised: 12/11/2011 Document Reviewed: 10/30/2011 [facility] Patient Information 2015 [place], [place]. This information is not intended to replace advice given to you by your health care provider. Make sure you discuss any questions you have with your health care provider. Dental Caries Dental caries is tooth decay. This decay can cause a hole in teeth (cavity) that can get bigger and deeper over time. HOME CARE  Brush and  floss your teeth. Do this at least two times a day.  Use a fluoride toothpaste.  Use a mouth rinse if told by your dentist or doctor.  Eat less sugary and starchy foods. Drink less sugary drinks.  Avoid snacking often on sugary and starchy foods. Avoid sipping often on sugary drinks.  Keep regular checkups and cleanings with your dentist.  Use fluoride supplements if told by your dentist or doctor.  Allow fluoride to be applied to teeth if told by your dentist or doctor. Document Released: 06/27/2008 Document Revised: 02/02/2014 Document Reviewed: 09/20/2012 [facility] Patient Information 2015 [place], [place]. This information is not intended to replace advice given to you by your health care provider. Make sure you discuss any questions you have with your health care provider. Dental Care and Dentist Visits Dental care supports good overall health. Regular dental visits can also help you avoid dental pain, bleeding, infection, and other more serious health problems in the future. It is important to keep the mouth healthy because diseases in the teeth, gums, and other oral tissues can spread to other areas of the body. Some problems, such as diabetes, heart disease, and pre-term labor have been associated with poor oral health.  See your dentist every 6 months. If you experience emergency problems such as a toothache or broken tooth, go to the dentist right away. If you see your dentist regularly, you may catch problems early. It is easier to be treated for problems in the early stages.  WHAT TO EXPECT AT A DENTIST VISIT  Your dentist will look for many common oral health problems and recommend proper treatment. At your regular dental visit, you can expect:  Gentle cleaning of the teeth and gums. This includes scraping and polishing. This helps to remove the sticky substance around the teeth and gums (plaque). Plaque forms in the mouth shortly after eating. Over time, plaque hardens on the teeth  as tartar. If tartar is not removed regularly, it can cause problems. Cleaning also helps remove stains.  Periodic X-rays. These pictures of the teeth and supporting bone will help your dentist assess the health of your teeth.  Periodic fluoride treatments. Fluoride is a natural mineral shown to help strengthen teeth. Fluoride treatmentinvolves applying a fluoride gel or varnish to the teeth. It is most commonly done in children.  Examination of the mouth, tongue, jaws, teeth, and gums to look for any oral health problems, such as:  Cavities (dental caries). This is decay on the tooth caused by plaque, sugar, and acid in the mouth. It is best to catch a cavity when it is small.  Inflammation of the gums caused by plaque buildup (gingivitis).  Problems with the mouth  or malformed or misaligned teeth.  Oral cancer or other diseases of the soft tissues or jaws. KEEP YOUR TEETH AND GUMS HEALTHY For healthy teeth and gums, follow these general guidelines as well as your dentist's specific advice:  Have your teeth professionally cleaned at the dentist every 6 months.  Brush twice daily with a fluoride toothpaste.  Floss your teeth daily.  Ask your dentist if you need fluoride supplements, treatments, or fluoride toothpaste.  Eat a healthy diet. Reduce foods and drinks with added sugar.  Avoid smoking. TREATMENT FOR ORAL HEALTH PROBLEMS If you have oral health problems, treatment varies depending on the conditions present in your teeth and gums.  Your caregiver will most likely recommend good oral hygiene at each visit.  For cavities, gingivitis, or other oral health disease, your caregiver will perform a procedure to treat the problem. This is typically done at a separate appointment. Sometimes your caregiver will refer you to another dental specialist for specific tooth problems or for surgery. SEEK IMMEDIATE DENTAL CARE IF:  You have pain, bleeding, or soreness in the gum, tooth,  jaw, or mouth area.  A permanent tooth becomes loose or separated from the gum socket.  You experience a blow or injury to the mouth or jaw area. Document Released: 05/31/2011 Document Revised: 12/11/2011 Document Reviewed: 05/31/2011 [facility] Patient Information 2015 [place], [place]. This information is not intended to replace advice given to you by your health care provider. Make sure you discuss any questions you have with your health care provider.

## 2014-08-07 NOTE — ED Provider Notes (Signed)
CSN: [id number]     Arrival date & time 08/07/14  1042 History   First MD Initiated Contact with Patient 08/07/14 1128     Chief Complaint  Patient presents with  . Back Pain     (Consider location/radiation/quality/duration/timing/severity/associated sxs/prior Treatment) HPI  23 year old female who presents today with multiple complaints. She states that she has had palpitations. She felt her heart was going past this morning when she awoke that has since resolved. She states that intermittently feels a good discussion past. She has no associated symptoms with this. She has not done any interventions for this. It is not painful. She is unable to identify inciting or relieving factors. She has not had before. She is not currently having any symptoms. She denies any fever, chest pain, shortness of breath, volume depletion, history of DVT or PE. She states that she has pain in her left jaw consistent with a dental abscess. She says she has been on antibiotics for this and has not cleared up. She states there is a swollen area below her left first she has known dental caries. She has not seen an dentist. She has not had increase in swelling, fever, pain in floor of her mouth or throat. She is able to eat and drink and speak without any difficulties. . She states she has pain in her low back when she bends over. He is not having any radiating pain to one leg or the other, weakness, or change in sensation. Began an unknown period of time ago. Is not taking any medication for this. She cannot describe any increasing or worsening factors. The pain is currently not present but is moderate when she bends over. She denies any urinary tract infection symptoms symptoms, trauma to her back such as a fall, or lateralized symptoms.  History reviewed. No pertinent past medical history. Past Surgical History  Procedure Laterality Date  . Dilation and curettage of uterus     No family history on file. History    Substance Use Topics  . Smoking status: Never Smoker   . Smokeless tobacco: Not on file  . Alcohol Use: Yes   OB History    No data available     Review of Systems  All other systems reviewed and are negative.     Allergies  Amoxicillin  Home Medications   Prior to Admission medications   Medication Sig Start Date End Date Taking? Authorizing Provider  clindamycin (CLEOCIN) 150 MG capsule Take 2 capsules (300 mg total) by mouth every 6 (six) hours. 10/22/12   Elson AreasLeslie K Sofia, PA-C  HYDROcodone-acetaminophen (NORCO/VICODIN) 5-325 MG per tablet Take 2 tablets by mouth every 4 (four) hours as needed for pain. 10/22/12   Elson AreasLeslie K Sofia, PA-C  HYDROcodone-acetaminophen (NORCO/VICODIN) 5-325 MG per tablet Take 2 tablets by mouth every 4 (four) hours as needed for pain. 04/14/13   Elson AreasLeslie K Sofia, PA-C  HYDROcodone-acetaminophen (NORCO/VICODIN) 5-325 MG per tablet Take 1-2 tablets by mouth every 4 (four) hours as needed for moderate pain or severe pain. 07/21/14   Harle BattiestElizabeth Tysinger, NP  ondansetron (ZOFRAN) 4 MG tablet Take 1 tablet (4 mg total) by mouth every 6 (six) hours. 06/03/14   Ethelda ChickMartha K Linker, MD   BP 108/63 mmHg  Pulse 77  Temp(Src) 98.1 F (36.7 C) (Oral)  Resp 18  Ht 5\' 9"  (1.753 m)  Wt 195 lb (88.451 kg)  BMI 28.78 kg/m2  SpO2 100%  LMP 07/21/2014 Physical Exam  Constitutional: She is oriented to  person, place, and time. She appears well-developed and well-nourished.  HENT:  Head: Normocephalic and atraumatic.  Right Ear: Tympanic membrane and external ear normal.  Left Ear: Tympanic membrane and external ear normal.  Nose: Nose normal. Right sinus exhibits no maxillary sinus tenderness and no frontal sinus tenderness. Left sinus exhibits no maxillary sinus tenderness and no frontal sinus tenderness.  Mouth/Throat:    Swelling below the first molar on the lower left side. There is no fluctuance noted it is localized with the tooth. There is no submandibular swelling  or fluctuance noted oropharynx is clear. Neck is supple and there is no adenopathy noted.  Eyes: Conjunctivae and EOM are normal. Pupils are equal, round, and reactive to light. Right eye exhibits no nystagmus. Left eye exhibits no nystagmus.  Neck: Normal range of motion. Neck supple.  Cardiovascular: Normal rate, regular rhythm, normal heart sounds and intact distal pulses.   Pulmonary/Chest: Effort normal and breath sounds normal. No respiratory distress. She exhibits no tenderness.  Abdominal: Soft. Bowel sounds are normal. She exhibits no distension and no mass. There is no tenderness.  Musculoskeletal: Normal range of motion. She exhibits no edema or tenderness.  Neurological: She is alert and oriented to person, place, and time. She has normal strength and normal reflexes. No sensory deficit. She displays a negative Romberg sign. GCS eye subscore is 4. GCS verbal subscore is 5. GCS motor subscore is 6.  Reflex Scores:      Tricep reflexes are 2+ on the right side and 2+ on the left side.      Bicep reflexes are 2+ on the right side and 2+ on the left side.      Brachioradialis reflexes are 2+ on the right side and 2+ on the left side.      Patellar reflexes are 2+ on the right side and 2+ on the left side.      Achilles reflexes are 2+ on the right side and 2+ on the left side. Patient with normal gait without ataxia, shuffling, spasm, or antalgia. Speech is normal without dysarthria, dysphasia, or aphasia. Muscle strength is 5/5 in bilateral shoulders, elbow flexor and extensors, wrist flexor and extensors, and intrinsic hand muscles. 5/5 bilateral lower extremity hip flexors, extensors, knee flexors and extensors, and ankle dorsi and plantar flexors.    Skin: Skin is warm and dry. No rash noted.  Psychiatric: She has a normal mood and affect. Her behavior is normal. Judgment and thought content normal.  Nursing note and vitals reviewed.   ED Course  Procedures (including critical  care time) Labs Review Labs Reviewed  URINALYSIS, ROUTINE W REFLEX MICROSCOPIC - Abnormal; Notable for the following:    APPearance CLOUDY (*)    pH 8.5 (*)    All other components within normal limits  PREGNANCY, URINE    Imaging Review No results found.   EKG Interpretation None      MDM   Final diagnoses:  Mouth swelling  Palpitations  Midline low back pain without sciatica   She reports palpitations. Here she has a normal crit of vascular exam. I have no suspicion for pulmonary embolism, infection, and has no history consistent with hyperthyroidism. 2 2 patient with dental pain. She has been on several rounds of antibiotics. I discussed with her that she needs to see a dentist to have a Panorex done. We have ability Panorex here. This is not appear to be an active abscess but she does need close follow-up and I have  discussed this patient 3 low back pain patient has pain with bending over and is advised on conservative treatment options. She is to use ibuprofen and to modify her lifting form.   Hilario Quarry, MD 08/07/14 [phone]

## 2014-08-07 NOTE — ED Notes (Signed)
Lower back pain. Abscess to her tooth. Feels like her heart is beating fast.

## 2014-10-26 ENCOUNTER — Emergency Department (HOSPITAL_BASED_OUTPATIENT_CLINIC_OR_DEPARTMENT_OTHER)
Admission: EM | Admit: 2014-10-26 | Discharge: 2014-10-26 | Disposition: A | Discharge status: Home / Self Care | Payer: Medicaid Other | Attending: Emergency Medicine | Admitting: Emergency Medicine

## 2014-10-26 ENCOUNTER — Encounter (HOSPITAL_BASED_OUTPATIENT_CLINIC_OR_DEPARTMENT_OTHER): Payer: Self-pay | Admitting: *Deleted

## 2014-10-26 DIAGNOSIS — Z880 Allergy status to penicillin: Secondary | ICD-10-CM | POA: Diagnosis not present

## 2014-10-26 DIAGNOSIS — R0989 Other specified symptoms and signs involving the circulatory and respiratory systems: Secondary | ICD-10-CM

## 2014-10-26 DIAGNOSIS — J029 Acute pharyngitis, unspecified: Secondary | ICD-10-CM | POA: Diagnosis present

## 2014-10-26 MED ORDER — DIAZEPAM 5 MG/ML IJ SOLN
5.0000 mg | Freq: Once | INTRAMUSCULAR | Status: AC
  Administered 2014-10-26: 5 mg via INTRAMUSCULAR
  Filled 2014-10-26: qty 2

## 2014-10-26 MED ORDER — GLUCAGON HCL RDNA (DIAGNOSTIC) 1 MG IJ SOLR
1.0000 mg | Freq: Once | INTRAMUSCULAR | Status: AC
  Administered 2014-10-26: 1 mg via INTRAMUSCULAR
  Filled 2014-10-26: qty 1

## 2014-10-26 MED ORDER — ALUM & MAG HYDROXIDE-SIMETH 200-200-20 MG/5ML PO SUSP
15.0000 mL | Freq: Once | ORAL | Status: AC
  Administered 2014-10-26: 15 mL via ORAL
  Filled 2014-10-26: qty 30

## 2014-10-26 MED ORDER — ALUMINUM & MAGNESIUM HYDROXIDE 200-200 MG/5ML PO SUSP: 10.0000 mL | Freq: Four times a day (QID) | ORAL | Status: DC | PRN

## 2014-10-26 NOTE — ED Provider Notes (Signed)
CSN: [id number]     Arrival date & time 10/26/14  1943 History   First MD Initiated Contact with Patient 10/26/14 2005     Chief Complaint  Patient presents with  . Sore Throat     (Consider location/radiation/quality/duration/timing/severity/associated sxs/prior Treatment) HPI  Christina Arnold is a 24 y.o. female presenting with three day history of foreign body sensation in throat. Patient states she didn't eat for 4 days but was drinking. She started eating and then noticed this form body sensation. Patient states she feels she cannot clear her throat. She does not note this happened after eating any foods. She denies any pulling. No nausea, vomiting, abdominal pain. Patient notes this is worse with swallowing but has not had any fevers or chills. No cough or congestion or URI symptoms. No shortness of breath, chest pain, difficulty breathing or swallowing.   History reviewed. No pertinent past medical history. Past Surgical History  Procedure Laterality Date  . Dilation and curettage of uterus     History reviewed. No pertinent family history. History  Substance Use Topics  . Smoking status: Never Smoker   . Smokeless tobacco: Not on file  . Alcohol Use: Yes   OB History    No data available     Review of Systems 10 Systems reviewed and are negative for acute change except as noted in the HPI.    Allergies  Amoxicillin  Home Medications   Prior to Admission medications   Medication Sig Start Date End Date Taking? Authorizing Provider  aluminum-magnesium hydroxide 200-200 MG/5ML suspension Take 10 mLs by mouth every 6 (six) hours as needed for indigestion. 10/26/14   Louann SjogrenVictoria L Elina Streng, PA-C   BP 106/62 mmHg  Pulse 88  Resp 16  Ht 5\' 9"  (1.753 m)  Wt 180 lb (81.647 kg)  BMI 26.57 kg/m2  SpO2 100%  LMP 10/05/2014 Physical Exam  Constitutional: She appears well-developed and well-nourished. No distress.  HENT:  Head: Normocephalic and atraumatic.  Mouth/Throat:  Oropharynx is clear and moist. No oropharyngeal exudate.  No trismus or uvula deviation.  Eyes: Conjunctivae and EOM are normal. Right eye exhibits no discharge. Left eye exhibits no discharge.  Neck: Normal range of motion. Neck supple.  Cardiovascular: Normal rate, regular rhythm and normal heart sounds.   Pulmonary/Chest: Effort normal and breath sounds normal. No respiratory distress. She has no wheezes.  Abdominal: Soft. Bowel sounds are normal. She exhibits no distension. There is no tenderness.  Lymphadenopathy:    She has no cervical adenopathy.  Neurological: She is alert. She exhibits normal muscle tone. Coordination normal.  Skin: Skin is warm and dry. She is not diaphoretic.  Nursing note and vitals reviewed.   ED Course  Procedures (including critical care time) Labs Review Labs Reviewed - No data to display  Imaging Review No results found.   EKG Interpretation None      MDM   Final diagnoses:  Foreign body sensation in throat   Patient with sensation of foreign body in throat after eating after 4 days of not eating.  This developed 3 days ago. Patient without trismus or uvula deviation. No drooling. Patient able to tolerate oral fluids and food. Patient without improvement with glucagon, Valium, Maalox. Patient interested in going home and refuses chest x-ray or further imaging. She's been given a referral to GI for further workup with persistent symptoms. Pt stable for discharge.   Discussed return precautions with patient. Discussed all results and patient verbalizes understanding and agrees with  plan.   Louann Sjogren, PA-C 10/26/14 2225  Richardean Canal, MD 10/26/14 [phone]

## 2014-10-26 NOTE — Discharge Instructions (Signed)
Return to the emergency room with worsening of symptoms, new symptoms or with symptoms that are concerning, especially fevers, coughing up thick colored uterus, blood, drooling, shortness of breath, unable to swallow, unable to open mouth, choking, nausea and vomiting. Maalox every 6 hours.  Make an appointment with a GI doctor as soon as possible for follow-up. Read below information and follow all instructions.  Swallowed Foreign Body, Adult You have swallowed an object (foreign body). Once the foreign body has passed through the food tube (esophagus), which leads from the mouth to the stomach, it will usually continue through the body without problems. This is because the point where the esophagus enters into the stomach is the narrowest place through which the foreign body must pass. Sometimes the foreign body gets stuck. The most common type of foreign body obstruction in adults is food impaction. Many times, bones from fish or meat products may become lodged in the esophagus or injure the throat on the way down. When there is an object that obstructs the esophagus, the most obvious symptoms are pain and the inability to swallow normally. In some cases, foreign bodies that can be life threatening are swallowed. Examples of these are certain medications and illicit drugs. Often in these instances, patients are afraid of telling what they swallowed. However, it is extremely important to tell the emergency caregiver what was swallowed because life-saving treatment may be needed.  X-ray exams may be taken to find the location of the foreign body. However, some objects do not show up well or may be too small to be seen on an X-ray image. If the foreign body is too large or too sharp, it may be too dangerous to allow it to pass on its own. You may need to see a caregiver who specializes in the digestive system (gastroenterologist). In a few cases, a specialist may need to remove the object using a method  called "endoscopy". This involves passing a thin, soft, flexible tube into the food pipe to locate and remove the object. Follow up with your primary doctor or the referral you were given by the emergency caregiver. HOME CARE INSTRUCTIONS   If your caregiver says it is safe for you to eat, then only have liquids and soft foods until your symptoms improve.  Once you are eating normally:  Cut food into small pieces.  Remove small bones from food.  Remove large seeds and pits from fruit.  Chew your food well.  Do not talk, laugh, or engage in physical activity while eating or swallowing. SEEK MEDICAL CARE IF:  You develop worsening shortness of breath, uncontrollable coughing, chest pains or high fever, greater than 102 F (38.9 C).  You are unable to eat or drink or you feel that food is getting stuck in your throat.  You have choking symptoms or cannot stop drooling.  You develop abdominal pain, vomiting (especially of blood), or rectal bleeding. MAKE SURE YOU:   Understand these instructions.  Will watch your condition.  Will get help right away if you are not doing well or get worse. Document Released: 03/08/2010 Document Revised: 12/11/2011 Document Reviewed: 03/08/2010 [facility] Patient Information 2015 [place], [place]. This information is not intended to replace advice given to you by your health care provider. Make sure you discuss any questions you have with your health care provider.

## 2014-10-26 NOTE — ED Notes (Signed)
Patient reports that she does not want to stay for the chest x-ray. She is in a rush to leave. NP aware. Patient awaiting D/C

## 2014-10-26 NOTE — ED Notes (Signed)
Pt c/o sore throat ? Foreign body in throat x 3 days

## 2015-01-03 ENCOUNTER — Emergency Department (HOSPITAL_BASED_OUTPATIENT_CLINIC_OR_DEPARTMENT_OTHER)
Admission: EM | Admit: 2015-01-03 | Discharge: 2015-01-03 | Disposition: A | Discharge status: Home / Self Care | Payer: Medicaid Other | Attending: Emergency Medicine | Admitting: Emergency Medicine

## 2015-01-03 ENCOUNTER — Encounter (HOSPITAL_BASED_OUTPATIENT_CLINIC_OR_DEPARTMENT_OTHER): Payer: Self-pay

## 2015-01-03 DIAGNOSIS — Z880 Allergy status to penicillin: Secondary | ICD-10-CM | POA: Insufficient documentation

## 2015-01-03 DIAGNOSIS — F419 Anxiety disorder, unspecified: Secondary | ICD-10-CM | POA: Diagnosis not present

## 2015-01-03 DIAGNOSIS — R51 Headache: Secondary | ICD-10-CM | POA: Diagnosis present

## 2015-01-03 DIAGNOSIS — H53149 Visual discomfort, unspecified: Secondary | ICD-10-CM | POA: Diagnosis not present

## 2015-01-03 DIAGNOSIS — R519 Headache, unspecified: Secondary | ICD-10-CM

## 2015-01-03 DIAGNOSIS — J029 Acute pharyngitis, unspecified: Secondary | ICD-10-CM | POA: Diagnosis not present

## 2015-01-03 DIAGNOSIS — Z3202 Encounter for pregnancy test, result negative: Secondary | ICD-10-CM | POA: Insufficient documentation

## 2015-01-03 LAB — URINALYSIS, ROUTINE W REFLEX MICROSCOPIC
BILIRUBIN URINE: NEGATIVE
GLUCOSE, UA: NEGATIVE mg/dL
HGB URINE DIPSTICK: NEGATIVE
KETONES UR: NEGATIVE mg/dL
Leukocytes, UA: NEGATIVE
Nitrite: NEGATIVE
PH: 7 (ref 5.0–8.0)
PROTEIN: NEGATIVE mg/dL
Specific Gravity, Urine: 1.002 — ABNORMAL LOW (ref 1.005–1.030)
UROBILINOGEN UA: 0.2 mg/dL (ref 0.0–1.0)

## 2015-01-03 LAB — RAPID URINE DRUG SCREEN, HOSP PERFORMED
Amphetamines: POSITIVE — AB
Barbiturates: NOT DETECTED
Benzodiazepines: NOT DETECTED
COCAINE: NOT DETECTED
Opiates: NOT DETECTED
TETRAHYDROCANNABINOL: NOT DETECTED

## 2015-01-03 LAB — CBG MONITORING, ED: Glucose-Capillary: 102 mg/dL — ABNORMAL HIGH (ref 70–99)

## 2015-01-03 LAB — PREGNANCY, URINE: Preg Test, Ur: NEGATIVE

## 2015-01-03 MED ORDER — IBUPROFEN 800 MG PO TABS: 800.0000 mg | ORAL_TABLET | Freq: Once | ORAL | Status: DC

## 2015-01-03 MED ORDER — KETOROLAC TROMETHAMINE 60 MG/2ML IM SOLN: 60.0000 mg | Freq: Once | INTRAMUSCULAR | Status: DC

## 2015-01-03 MED ORDER — IBUPROFEN 800 MG PO TABS: 800.0000 mg | ORAL_TABLET | Freq: Three times a day (TID) | ORAL | Status: DC

## 2015-01-03 MED ORDER — IBUPROFEN 800 MG PO TABS
800.0000 mg | ORAL_TABLET | Freq: Once | ORAL | Status: AC
  Administered 2015-01-03: 800 mg via ORAL
  Filled 2015-01-03: qty 1

## 2015-01-03 NOTE — ED Notes (Signed)
Pt in room on mobile phone, pt has asked for pain med prescription, states will take PO med just ordered by EDP and is going to leave since "my head is really hurting and I have blurred vision", explained care to pt and ask she not leave until speaking with EDP, Pt will not answer questions from this RN, but cont to talk on mobile phone.

## 2015-01-03 NOTE — ED Provider Notes (Signed)
CSN: [id number]     Arrival date & time 01/03/15  1301 History   First MD Initiated Contact with Patient 01/03/15 1713     Chief Complaint  Patient presents with  . Blurred Vision     (Consider location/radiation/quality/duration/timing/severity/associated sxs/prior Treatment) HPI Christina Arnold is a 24 y.o. female with no medical problems presents to emergency department complaining of headache and blurred vision for 3 days. States today developed sore throat as well. Denies any fever. No head injuries. She states that her headache is generalized, reports photophobia, no nausea or vomiting. She states she took a "Roxy 30" this morning for a headache which did not help. She states she has history of similar headaches in the past. States normally she just goes to sleep and it gets better. She states that she has a lot to do today and unable to go to bed.  History reviewed. No pertinent past medical history. Past Surgical History  Procedure Laterality Date  . Dilation and curettage of uterus     No family history on file. History  Substance Use Topics  . Smoking status: Never Smoker   . Smokeless tobacco: Not on file  . Alcohol Use: Yes     Comment: occ   OB History    No data available     Review of Systems  Constitutional: Negative for fever and chills.  HENT: Positive for sore throat.   Eyes: Positive for photophobia.  Respiratory: Negative for cough, chest tightness and shortness of breath.   Cardiovascular: Negative for chest pain, palpitations and leg swelling.  Gastrointestinal: Negative for nausea, vomiting, abdominal pain and diarrhea.  Musculoskeletal: Negative for myalgias, arthralgias, neck pain and neck stiffness.  Skin: Negative for rash.  Neurological: Positive for headaches. Negative for dizziness and weakness.  All other systems reviewed and are negative.     Allergies  Amoxicillin  Home Medications   Prior to Admission medications   Medication Sig  Start Date End Date Taking? Authorizing Provider  aluminum-magnesium hydroxide 200-200 MG/5ML suspension Take 10 mLs by mouth every 6 (six) hours as needed for indigestion. 10/26/14   Oswaldo ConroyVictoria Creech, PA-C  ibuprofen (ADVIL,MOTRIN) 800 MG tablet Take 1 tablet (800 mg total) by mouth 3 (three) times daily. 01/03/15   Aliani Caccavale, PA-C   BP 118/87 mmHg  Pulse 109  Temp(Src) 98.5 F (36.9 C) (Oral)  Resp 18  Ht 5\' 9"  (1.753 m)  Wt 180 lb (81.647 kg)  BMI 26.57 kg/m2  SpO2 95%  LMP 12/13/2014 Physical Exam  Constitutional: She is oriented to person, place, and time. She appears well-developed and well-nourished.  Patient appears to be anxious, she constantly moving during examination grinding in her hair, having a very grandiose movements of her arms as she is talking. Constantly changing positions.  HENT:  Head: Normocephalic and atraumatic.  Right Ear: External ear normal.  Left Ear: External ear normal.  Nose: Nose normal.  Mouth/Throat: Oropharynx is clear and moist.  Eyes: Conjunctivae and EOM are normal. Pupils are equal, round, and reactive to light.  Neck: Normal range of motion. Neck supple.  Cardiovascular: Normal rate, regular rhythm and normal heart sounds.   Pulmonary/Chest: Effort normal and breath sounds normal. No respiratory distress. She has no wheezes. She has no rales.  Abdominal: Soft. Bowel sounds are normal. She exhibits no distension. There is no tenderness. There is no rebound.  Musculoskeletal: She exhibits no edema.  Neurological: She is alert and oriented to person, place, and time.  5/5  and equal upper and lower extremity strength bilaterally. Equal grip strength bilaterally. Normal finger to nose and heel to shin. No pronator drift.   Skin: Skin is warm and dry.  Psychiatric: She has a normal mood and affect. Her behavior is normal.  Nursing note and vitals reviewed.   ED Course  Procedures (including critical care time) Labs Review Labs Reviewed   URINALYSIS, ROUTINE W REFLEX MICROSCOPIC - Abnormal; Notable for the following:    Specific Gravity, Urine 1.002 (*)    All other components within normal limits  CBG MONITORING, ED - Abnormal; Notable for the following:    Glucose-Capillary 102 (*)    All other components within normal limits  PREGNANCY, URINE  URINE RAPID DRUG SCREEN (HOSP PERFORMED)    Imaging Review No results found.   EKG Interpretation None      MDM   Final diagnoses:  Nonintractable headache, unspecified chronicity pattern, unspecified headache type    Patient with headache, advised him one to get some blood work, give her migraine cocktail. Her exam is pretty unremarkable except for her bizarre behavior. Patient refused any treatment. She stated she had to pick up her children and just wanted ibuprofen 800 mg. States it usually helps her headache. I offered her migraine cocktail which she refused. I'm unable to diagnose or rule out any emergent condition without doing any blood work or imaging but she states she does not want that. Advised to return if her symptoms are worsening.  Filed Vitals:   01/03/15 1313 01/03/15 1626  BP: 91/67 118/87  Pulse: 94 109  Temp: 98.5 F (36.9 C)   TempSrc: Oral   Resp: 18 18  Height:  (1.753 m)   Weight: 180 lb (81.647 kg)   SpO2: 100% 95%       Jaynie Crumble, PA-C 01/03/15 1747  Doug Sou, MD 01/04/15 [phone]

## 2015-01-03 NOTE — ED Notes (Signed)
Pt refuses to get into gown

## 2015-01-03 NOTE — ED Notes (Signed)
DC Note: Prescription given to patient, pt states having difficulty with reading and seeing at times, recommended pt make eye exam appointment and follow up with Primary MD for possible referral to Headache Specialist. Pt states "I will tomorrow", pt immediately left upon rec prescription from ED provider.

## 2015-01-03 NOTE — ED Notes (Signed)
Pt states pain is really bad, "the light hurts my eyes" also stated "I need to go home and lay down in the dark" Pt using profanity with nsg staff. Again attempted to explain care being provided and importance of providing safe care to her during this visit

## 2015-01-03 NOTE — ED Notes (Addendum)
Pt appears very distracted during triage, refusing to be triaged until she sends multiple people phone numbers on cell phone.  Reporting multiple complaints, appears slightly agitated with multiple hand movements during triage, reports falling 3 times today b/c "can't keep my body still", blurred vision, urinary frequency and polydipsia.  Symptoms for 3 days, laryngitis, denies cough and congestion.  Reports has had sob although not currently noted in triage, talking in full sentences without distress.  Also states has had some vaginal discharge recently.

## 2015-01-03 NOTE — Discharge Instructions (Signed)
Take ibuprofen for your headache. Avoid any opiods for your headache in the future. Follow up with your doctor.   General Headache Without Cause A headache is pain or discomfort felt around the head or neck area. The specific cause of a headache may not be found. There are many causes and types of headaches. A few common ones are:  Tension headaches.  Migraine headaches.  Cluster headaches.  Chronic daily headaches. HOME CARE INSTRUCTIONS   Keep all follow-up appointments with your caregiver or any specialist referral.  Only take over-the-counter or prescription medicines for pain or discomfort as directed by your caregiver.  Lie down in a dark, quiet room when you have a headache.  Keep a headache journal to find out what may trigger your migraine headaches. For example, write down:  What you eat and drink.  How much sleep you get.  Any change to your diet or medicines.  Try massage or other relaxation techniques.  Put ice packs or heat on the head and neck. Use these 3 to 4 times per day for 15 to 20 minutes each time, or as needed.  Limit stress.  Sit up straight, and do not tense your muscles.  Quit smoking if you smoke.  Limit alcohol use.  Decrease the amount of caffeine you drink, or stop drinking caffeine.  Eat and sleep on a regular schedule.  Get 7 to 9 hours of sleep, or as recommended by your caregiver.  Keep lights dim if bright lights bother you and make your headaches worse. SEEK MEDICAL CARE IF:   You have problems with the medicines you were prescribed.  Your medicines are not working.  You have a change from the usual headache.  You have nausea or vomiting. SEEK IMMEDIATE MEDICAL CARE IF:   Your headache becomes severe.  You have a fever.  You have a stiff neck.  You have loss of vision.  You have muscular weakness or loss of muscle control.  You start losing your balance or have trouble walking.  You feel faint or pass  out.  You have severe symptoms that are different from your first symptoms. MAKE SURE YOU:   Understand these instructions.  Will watch your condition.  Will get help right away if you are not doing well or get worse. Document Released: 09/18/2005 Document Revised: 12/11/2011 Document Reviewed: 10/04/2011 [facility] Patient Information 2015 [place], [place]. This information is not intended to replace advice given to you by your health care provider. Make sure you discuss any questions you have with your health care provider.

## 2015-01-03 NOTE — ED Notes (Signed)
Patient requested to put on a hospital gown, but  Refused and stated that she just wants something    for her headache and doesn't need anything else checked. SHe states that she has been drinking a lot of water lately and thought that was a symptom of diabetes. Patient states that she has had a headache for 3 days, took a "roxy 30" this morning and it did not help.

## 2015-09-05 ENCOUNTER — Emergency Department (HOSPITAL_BASED_OUTPATIENT_CLINIC_OR_DEPARTMENT_OTHER)
Admission: EM | Admit: 2015-09-05 | Discharge: 2015-09-05 | Disposition: A | Discharge status: Home / Self Care | Payer: Medicaid Other | Attending: Emergency Medicine | Admitting: Emergency Medicine

## 2015-09-05 ENCOUNTER — Encounter (HOSPITAL_BASED_OUTPATIENT_CLINIC_OR_DEPARTMENT_OTHER): Payer: Self-pay | Admitting: Emergency Medicine

## 2015-09-05 DIAGNOSIS — Z880 Allergy status to penicillin: Secondary | ICD-10-CM | POA: Diagnosis not present

## 2015-09-05 DIAGNOSIS — R42 Dizziness and giddiness: Secondary | ICD-10-CM | POA: Insufficient documentation

## 2015-09-05 DIAGNOSIS — H748X1 Other specified disorders of right middle ear and mastoid: Secondary | ICD-10-CM | POA: Insufficient documentation

## 2015-09-05 DIAGNOSIS — Z791 Long term (current) use of non-steroidal anti-inflammatories (NSAID): Secondary | ICD-10-CM | POA: Diagnosis not present

## 2015-09-05 DIAGNOSIS — H9201 Otalgia, right ear: Secondary | ICD-10-CM | POA: Insufficient documentation

## 2015-09-05 DIAGNOSIS — J0100 Acute maxillary sinusitis, unspecified: Secondary | ICD-10-CM | POA: Insufficient documentation

## 2015-09-05 MED ORDER — AZITHROMYCIN 250 MG PO TABS: 250.0000 mg | ORAL_TABLET | Freq: Every day | ORAL | Status: DC

## 2015-09-05 MED ORDER — AZITHROMYCIN 250 MG PO TABS
500.0000 mg | ORAL_TABLET | Freq: Once | ORAL | Status: AC
  Administered 2015-09-05: 500 mg via ORAL
  Filled 2015-09-05: qty 2

## 2015-09-05 MED ORDER — IBUPROFEN 800 MG PO TABS
800.0000 mg | ORAL_TABLET | Freq: Once | ORAL | Status: AC
  Administered 2015-09-05: 800 mg via ORAL
  Filled 2015-09-05: qty 1

## 2015-09-05 MED ORDER — IBUPROFEN 800 MG PO TABS: 800.0000 mg | ORAL_TABLET | Freq: Three times a day (TID) | ORAL | Status: AC

## 2015-09-05 NOTE — ED Notes (Signed)
Patient states that she is having Right ear pain x 1 day

## 2015-09-05 NOTE — ED Provider Notes (Signed)
CSN: [id number]     Arrival date & time 09/05/15  2112 History   First MD Initiated Contact with Patient 09/05/15 2142     Chief Complaint  Patient presents with  . Otalgia     (Consider location/radiation/quality/duration/timing/severity/associated sxs/prior Treatment) Patient is a 24 y.o. female presenting with ear pain. The history is provided by the patient.  Otalgia Location:  Right Quality:  Aching and sharp Severity:  Moderate Onset quality:  Gradual Duration:  1 day Chronicity:  New Relieved by:  Nothing Worsened by:  Nothing tried Ineffective treatments:  None tried Associated symptoms: congestion and rhinorrhea   Associated symptoms: no cough, no fever, no headaches, no hearing loss, no rash, no sore throat and no vomiting     History reviewed. No pertinent past medical history. Past Surgical History  Procedure Laterality Date  . Dilation and curettage of uterus     History reviewed. No pertinent family history. Social History  Substance Use Topics  . Smoking status: Never Smoker   . Smokeless tobacco: None  . Alcohol Use: Yes     Comment: occ   OB History    No data available     Review of Systems  Constitutional: Negative for fever.  HENT: Positive for congestion, ear pain and rhinorrhea. Negative for hearing loss and sore throat.   Respiratory: Negative for cough and shortness of breath.   Gastrointestinal: Negative for nausea and vomiting.  Musculoskeletal: Negative for myalgias.  Skin: Negative for rash.  Neurological: Positive for dizziness. Negative for headaches.      Allergies  Amoxicillin  Home Medications   Prior to Admission medications   Medication Sig Start Date End Date Taking? Authorizing Provider  aluminum-magnesium hydroxide 200-200 MG/5ML suspension Take 10 mLs by mouth every 6 (six) hours as needed for indigestion. 10/26/14   Oswaldo ConroyVictoria Creech, PA-C  ibuprofen (ADVIL,MOTRIN) 800 MG tablet Take 1 tablet (800 mg total) by mouth 3  (three) times daily. 01/03/15   Tatyana Kirichenko, PA-C   BP 102/75 mmHg  Pulse 63  Temp(Src) 98.4 F (36.9 C) (Oral)  Resp 18  Ht 5\' 9"  (1.753 m)  Wt 77.111 kg  BMI 25.09 kg/m2  SpO2 100%  LMP  (LMP Unknown) Physical Exam  Constitutional: She appears well-developed and well-nourished.  HENT:  Head: Normocephalic.  Right TM has erythematous border with middle ear effusion. Nasal mucosal edema that is mild, red. Oropharynx benign.   Eyes: Conjunctivae are normal.  Neck: Normal range of motion. Neck supple.  Cardiovascular: Normal rate.   Pulmonary/Chest: Effort normal.  Musculoskeletal: Normal range of motion.  Lymphadenopathy:    She has no cervical adenopathy.  Neurological: She is alert.  Skin: Skin is warm and dry.  Psychiatric: She has a normal mood and affect.    ED Course  Procedures (including critical care time) Labs Review Labs Reviewed - No data to display  Imaging Review No results found. I have personally reviewed and evaluated these images and lab results as part of my medical decision-making.   EKG Interpretation None      MDM   Final diagnoses:  None    1. Sinusitis 2. Otalgia, right  Suspect symptoms stemming from sinus inflammation and pressure. Patient appears moderately uncomfortable with abnormal ear exam. Will cover with Z-pack, provide anti-inflammatory pain relief. Encouraged PCP follow up for persistent symptoms.     Elpidio AnisShari Nivea Wojdyla, PA-C 09/05/15 2220  Loren Raceravid Yelverton, MD 09/06/15 [phone]

## 2015-09-05 NOTE — Discharge Instructions (Signed)
Earache An earache, also called otalgia, can be caused by many things. Pain from an earache can be sharp, dull, or burning. The pain may be temporary or constant. Earaches can be caused by problems with the ear, such as infection in either the middle ear or the ear canal, injury, impacted ear wax, middle ear pressure, or a foreign body in the ear. Ear pain can also result from problems in other areas. This is called referred pain. For example, pain can come from a sore throat, a tooth infection, or problems with the jaw or the joint between the jaw and the skull (temporomandibular joint, or TMJ). The cause of an earache is not always easy to identify. Watchful waiting may be appropriate for some earaches until a clear cause of the pain can be found. HOME CARE INSTRUCTIONS Watch your condition for any changes. The following actions may help to lessen any discomfort that you are feeling:  Take medicines only as directed by your health care provider. This includes ear drops.  Apply ice to your outer ear to help reduce pain.  Put ice in a plastic bag.  Place a towel between your skin and the bag.  Leave the ice on for 20 minutes, 2-3 times per day.  Do not put anything in your ear other than medicine that is prescribed by your health care provider.  Try resting in an upright position instead of lying down. This may help to reduce pressure in the middle ear and relieve pain.  Chew gum if it helps to relieve your ear pain.  Control any allergies that you have.  Keep all follow-up visits as directed by your health care provider. This is important. SEEK MEDICAL CARE IF:  Your pain does not improve within 2 days.  You have a fever.  You have new or worsening symptoms. SEEK IMMEDIATE MEDICAL CARE IF:  You have a severe headache.  You have a stiff neck.  You have difficulty swallowing.  You have redness or swelling behind your ear.  You have drainage from your ear.  You have hearing  loss.  You feel dizzy.   This information is not intended to replace advice given to you by your health care provider. Make sure you discuss any questions you have with your health care provider.   Document Released: 05/05/2004 Document Revised: 10/09/2014 Document Reviewed: 04/19/2014 Elsevier Interactive Patient Education 2016 [employer] Sinusitis, Adult Sinusitis is redness, soreness, and inflammation of the paranasal sinuses. Paranasal sinuses are air pockets within the bones of your face. They are located beneath your eyes, in the middle of your forehead, and above your eyes. In healthy paranasal sinuses, mucus is able to drain out, and air is able to circulate through them by way of your nose. However, when your paranasal sinuses are inflamed, mucus and air can become trapped. This can allow bacteria and other germs to grow and cause infection. Sinusitis can develop quickly and last only a short time (acute) or continue over a long period (chronic). Sinusitis that lasts for more than 12 weeks is considered chronic. CAUSES Causes of sinusitis include:  Allergies.  Structural abnormalities, such as displacement of the cartilage that separates your nostrils (deviated septum), which can decrease the air flow through your nose and sinuses and affect sinus drainage.  Functional abnormalities, such as when the small hairs (cilia) that line your sinuses and help remove mucus do not work properly or are not present. SIGNS AND SYMPTOMS Symptoms of acute and chronic  sinusitis are the same. The primary symptoms are pain and pressure around the affected sinuses. Other symptoms include:  Upper toothache.  Earache.  Headache.  Bad breath.  Decreased sense of smell and taste.  A cough, which worsens when you are lying flat.  Fatigue.  Fever.  Thick drainage from your nose, which often is green and may contain pus (purulent).  Swelling and warmth over the affected  sinuses. DIAGNOSIS Your health care provider will perform a physical exam. During your exam, your health care provider may perform any of the following to help determine if you have acute sinusitis or chronic sinusitis:  Look in your nose for signs of abnormal growths in your nostrils (nasal polyps).  Tap over the affected sinus to check for signs of infection.  View the inside of your sinuses using an imaging device that has a light attached (endoscope). If your health care provider suspects that you have chronic sinusitis, one or more of the following tests may be recommended:  Allergy tests.  Nasal culture. A sample of mucus is taken from your nose, sent to a lab, and screened for bacteria.  Nasal cytology. A sample of mucus is taken from your nose and examined by your health care provider to determine if your sinusitis is related to an allergy. TREATMENT Most cases of acute sinusitis are related to a viral infection and will resolve on their own within 10 days. Sometimes, medicines are prescribed to help relieve symptoms of both acute and chronic sinusitis. These may include pain medicines, decongestants, nasal steroid sprays, or saline sprays. However, for sinusitis related to a bacterial infection, your health care provider will prescribe antibiotic medicines. These are medicines that will help kill the bacteria causing the infection. Rarely, sinusitis is caused by a fungal infection. In these cases, your health care provider will prescribe antifungal medicine. For some cases of chronic sinusitis, surgery is needed. Generally, these are cases in which sinusitis recurs more than 3 times per year, despite other treatments. HOME CARE INSTRUCTIONS  Drink plenty of water. Water helps thin the mucus so your sinuses can drain more easily.  Use a humidifier.  Inhale steam 3-4 times a day (for example, sit in the bathroom with the shower running).  Apply a warm, moist washcloth to your face  3-4 times a day, or as directed by your health care provider.  Use saline nasal sprays to help moisten and clean your sinuses.  Take medicines only as directed by your health care provider.  If you were prescribed either an antibiotic or antifungal medicine, finish it all even if you start to feel better. SEEK IMMEDIATE MEDICAL CARE IF:  You have increasing pain or severe headaches.  You have nausea, vomiting, or drowsiness.  You have swelling around your face.  You have vision problems.  You have a stiff neck.  You have difficulty breathing.   This information is not intended to replace advice given to you by your health care provider. Make sure you discuss any questions you have with your health care provider.   Document Released: 09/18/2005 Document Revised: 10/09/2014 Document Reviewed: 10/03/2011 Elsevier Interactive Patient Education [employer]

## 2015-09-25 ENCOUNTER — Encounter (HOSPITAL_BASED_OUTPATIENT_CLINIC_OR_DEPARTMENT_OTHER): Payer: Self-pay | Admitting: Emergency Medicine

## 2015-09-25 ENCOUNTER — Emergency Department (HOSPITAL_BASED_OUTPATIENT_CLINIC_OR_DEPARTMENT_OTHER)
Admission: EM | Admit: 2015-09-25 | Discharge: 2015-09-25 | Disposition: A | Discharge status: Home / Self Care | Payer: Medicaid Other | Attending: Emergency Medicine | Admitting: Emergency Medicine

## 2015-09-25 DIAGNOSIS — Z792 Long term (current) use of antibiotics: Secondary | ICD-10-CM | POA: Diagnosis not present

## 2015-09-25 DIAGNOSIS — R451 Restlessness and agitation: Secondary | ICD-10-CM | POA: Diagnosis not present

## 2015-09-25 DIAGNOSIS — F419 Anxiety disorder, unspecified: Secondary | ICD-10-CM | POA: Insufficient documentation

## 2015-09-25 DIAGNOSIS — F1123 Opioid dependence with withdrawal: Secondary | ICD-10-CM | POA: Insufficient documentation

## 2015-09-25 DIAGNOSIS — R6889 Other general symptoms and signs: Secondary | ICD-10-CM

## 2015-09-25 DIAGNOSIS — R61 Generalized hyperhidrosis: Secondary | ICD-10-CM | POA: Insufficient documentation

## 2015-09-25 DIAGNOSIS — Z880 Allergy status to penicillin: Secondary | ICD-10-CM | POA: Insufficient documentation

## 2015-09-25 DIAGNOSIS — Z791 Long term (current) use of non-steroidal anti-inflammatories (NSAID): Secondary | ICD-10-CM | POA: Diagnosis not present

## 2015-09-25 MED ORDER — PROMETHAZINE HCL 25 MG PO TABS
12.5000 mg | ORAL_TABLET | Freq: Once | ORAL | Status: AC
  Administered 2015-09-25: 12.5 mg via ORAL
  Filled 2015-09-25: qty 1

## 2015-09-25 MED ORDER — PROMETHAZINE HCL 25 MG PO TABS: 25.0000 mg | ORAL_TABLET | Freq: Four times a day (QID) | ORAL | Status: DC | PRN

## 2015-09-25 MED ORDER — CLONIDINE HCL 0.1 MG PO TABS: 0.2000 mg | ORAL_TABLET | Freq: Two times a day (BID) | ORAL | Status: DC

## 2015-09-25 MED ORDER — IBUPROFEN 800 MG PO TABS
800.0000 mg | ORAL_TABLET | Freq: Once | ORAL | Status: AC
  Administered 2015-09-25: 800 mg via ORAL
  Filled 2015-09-25: qty 1

## 2015-09-25 MED ORDER — SODIUM CHLORIDE 0.9 % IV BOLUS (SEPSIS)
500.0000 mL | Freq: Once | INTRAVENOUS | Status: AC
  Administered 2015-09-25: 500 mL via INTRAVENOUS

## 2015-09-25 NOTE — ED Notes (Signed)
Pt asleep.

## 2015-09-25 NOTE — ED Provider Notes (Signed)
CSN: [id number]     Arrival date & time 09/25/15  1532 History   First MD Initiated Contact with Patient 09/25/15 1714     Chief Complaint  Patient presents with  . Withdrawal   HPI Christina Arnold is a 24 y.o. F with no significant PMH presenting with complaints of withdrawal. 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 She denies fevers, chills, SI thoughts, SOB, palpitations.   History reviewed. No pertinent past medical history. Past Surgical History  Procedure Laterality Date  . Dilation and curettage of uterus     History reviewed. No pertinent family history. Social History  Substance Use Topics  . Smoking status: Never Smoker   . Smokeless tobacco: None  . Alcohol Use: Yes     Comment: occ   OB History    No data available     Review of Systems  Ten systems are reviewed and are negative for acute change except as noted in the HPI   Allergies  Amoxicillin  Home Medications   Prior to Admission medications   Medication Sig Start Date End Date Taking? Authorizing Provider  aluminum-magnesium hydroxide 200-200 MG/5ML suspension Take 10 mLs by mouth every 6 (six) hours as needed for indigestion. 10/26/14   Oswaldo Conroy, PA-C  azithromycin (ZITHROMAX Z-PAK) 250 MG tablet Take 1 tablet (250 mg total) by mouth daily. 09/05/15   Elpidio Anis, PA-C  ibuprofen (ADVIL,MOTRIN) 800 MG tablet Take 1 tablet (800 mg total) by mouth 3 (three) times daily. 09/05/15   Shari Upstill, PA-C   BP 110/73 mmHg  Pulse 85  Temp(Src) 98.5 F (36.9 C) (Oral)  Resp 16  Ht  (1.753 m)  Wt 79.379 kg  BMI 25.83 kg/m2  SpO2 100%  LMP  (LMP Unknown) Physical Exam  Constitutional: She appears well-developed and well-nourished. No distress.  Mildly diaphoretic  HENT:  Head: Normocephalic and atraumatic.  Mouth/Throat: Oropharynx is clear and moist. No oropharyngeal exudate.  Eyes: Conjunctivae are normal. Pupils are equal,  round, and reactive to light. Right eye exhibits no discharge. Left eye exhibits no discharge. No scleral icterus.  Neck: Normal range of motion. No tracheal deviation present.  Cardiovascular: Normal rate, regular rhythm, normal heart sounds and intact distal pulses.  Exam reveals no gallop and no friction rub.   No murmur heard. Pulmonary/Chest: Effort normal and breath sounds normal. No respiratory distress. She has no wheezes. She has no rales. She exhibits no tenderness.  Abdominal: Soft. Bowel sounds are normal. She exhibits no distension and no mass. There is no tenderness. There is no rebound and no guarding.  Musculoskeletal: She exhibits no edema.  Lymphadenopathy:    She has no cervical adenopathy.  Neurological: She is alert. Coordination normal.  Skin: Skin is warm. No rash noted. No erythema.  Psychiatric: Her behavior is normal.  Agitated and anxious  Nursing note and vitals reviewed.   ED Course  Procedures  MDM   Final diagnoses:  None   Patient uncomfortable appearing. On initial examination, no tachycardia, tremors. Will give phenergan, ibuprofen and reassess. Patient requesting IV fluids but will hold off if patient is able to tolerate PO fluids. Patient refusing to eat/drink and requesting IV fluids. Will give 500 cc. No further workup is needed at this time. Patient may be safely discharged home with clonidine and phenergan. Discussed reasons for return. Patient to follow-up with primary care provider within one week. Patient in understanding and agreement with  the plan.    Melton KrebsSamantha Nicole Tobin Witucki, PA-C 10/07/15 2325  Tilden FossaElizabeth Rees, MD 10/08/15 [phone]

## 2015-09-25 NOTE — ED Notes (Signed)
Stepmother: Willaim ShengBillie: [phone]  Father: Mariana KaufmanMarvin: [phone]

## 2015-09-25 NOTE — ED Notes (Signed)
Patient ambulated out of the department with mother. Patient crying and stating "I am in pain, can you please help me". Patient reassured that we cannot give her narcotic pain medication at this time. Patient made aware that she needs to attempt to eat and drink. Patient leaving to get medications from pharmacy.

## 2015-09-25 NOTE — ED Notes (Signed)
Returned from checking on rooms for pt and pt had left triage and was found in the bathroom. States she will be a few more minutes, told she has a room available when she is ready.

## 2015-09-25 NOTE — ED Notes (Signed)
Pt exited bathroom after approx 10 minutes and appeared in much less distress, was no longer crying, breathing even and unlabored, still diaphoretic. Taken to room via wheelchair, able to stand and transfer to bed.

## 2015-09-25 NOTE — Discharge Instructions (Signed)
Ms. Christina Arnold,  Christina Arnold meeting you! Please follow-up with your primary care provider within one week. Return to the emergency department if you are unable to keep foods down, do not improve on medication. Feel better soon!  S. Lane HackerNicole Shannell Mikkelsen, PA-C

## 2015-09-25 NOTE — ED Notes (Signed)
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 States last use was last pm. Vitals stable.

## 2022-02-19 ENCOUNTER — Encounter (HOSPITAL_BASED_OUTPATIENT_CLINIC_OR_DEPARTMENT_OTHER): Payer: Self-pay | Admitting: Emergency Medicine

## 2022-02-19 ENCOUNTER — Other Ambulatory Visit: Payer: Self-pay

## 2022-02-19 ENCOUNTER — Emergency Department (HOSPITAL_BASED_OUTPATIENT_CLINIC_OR_DEPARTMENT_OTHER): Payer: Medicaid Other

## 2022-02-19 ENCOUNTER — Emergency Department (HOSPITAL_BASED_OUTPATIENT_CLINIC_OR_DEPARTMENT_OTHER)
Admission: EM | Admit: 2022-02-19 | Discharge: 2022-02-19 | Disposition: A | Discharge status: Home / Self Care | Payer: Medicaid Other | Attending: Emergency Medicine | Admitting: Emergency Medicine

## 2022-02-19 DIAGNOSIS — L02611 Cutaneous abscess of right foot: Secondary | ICD-10-CM | POA: Diagnosis present

## 2022-02-19 DIAGNOSIS — L02619 Cutaneous abscess of unspecified foot: Secondary | ICD-10-CM

## 2022-02-19 DIAGNOSIS — I10 Essential (primary) hypertension: Secondary | ICD-10-CM | POA: Insufficient documentation

## 2022-02-19 IMAGING — DX DG FOOT COMPLETE 3+V*R*
3 series · 3 of 3 positions shown · non-contrast
Comparison: None Available.

CLINICAL DATA: Right foot pain and redness

EXAM:
RIGHT FOOT COMPLETE - 3+ VIEW

[foot ap]
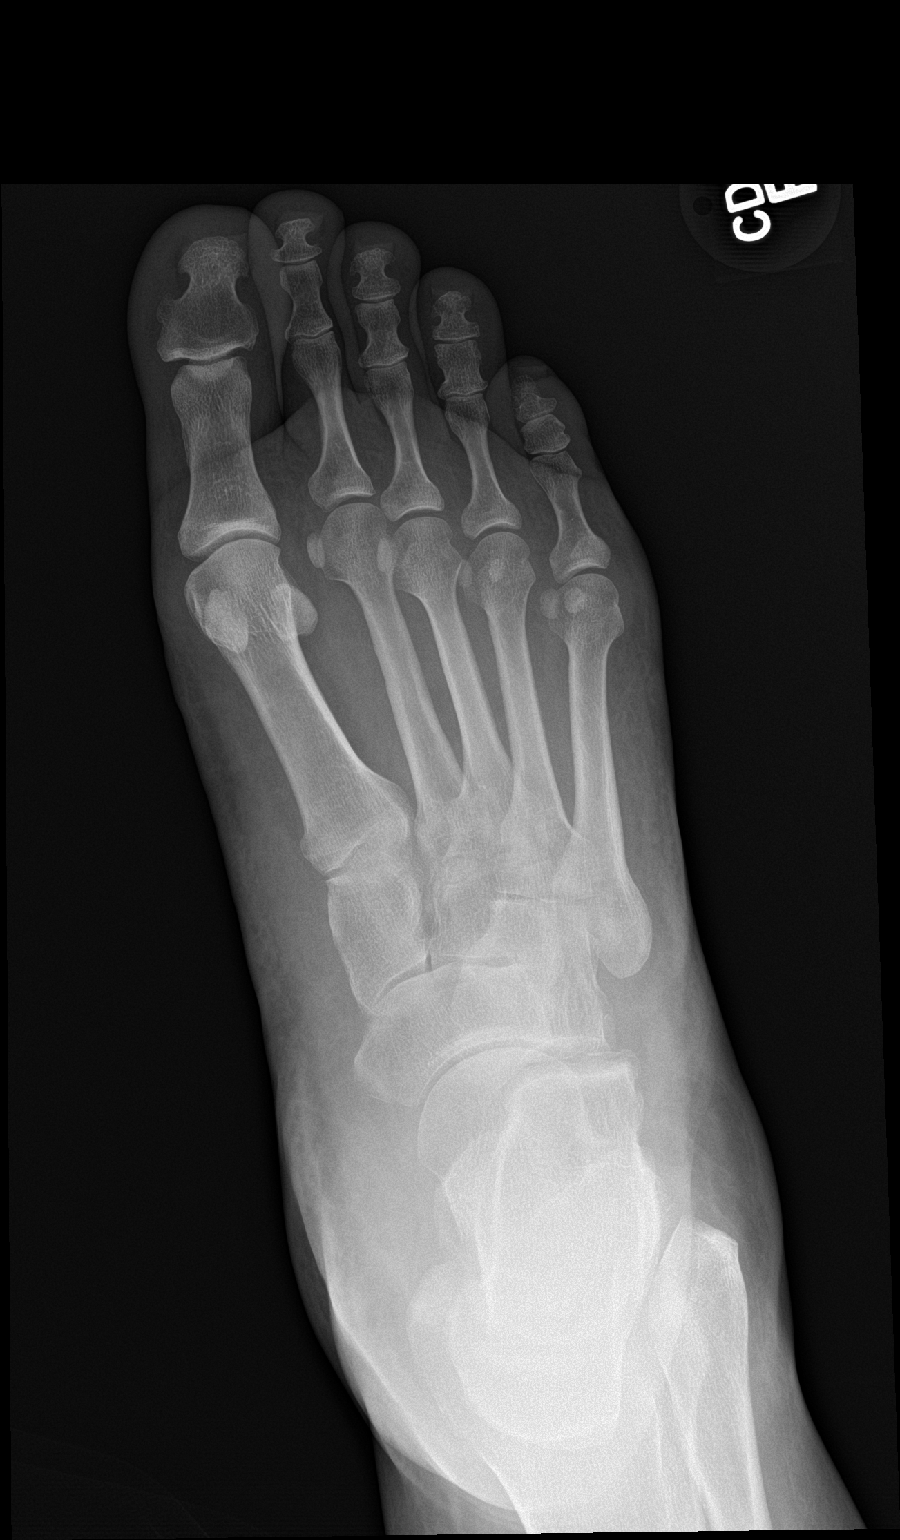

[foot obl]
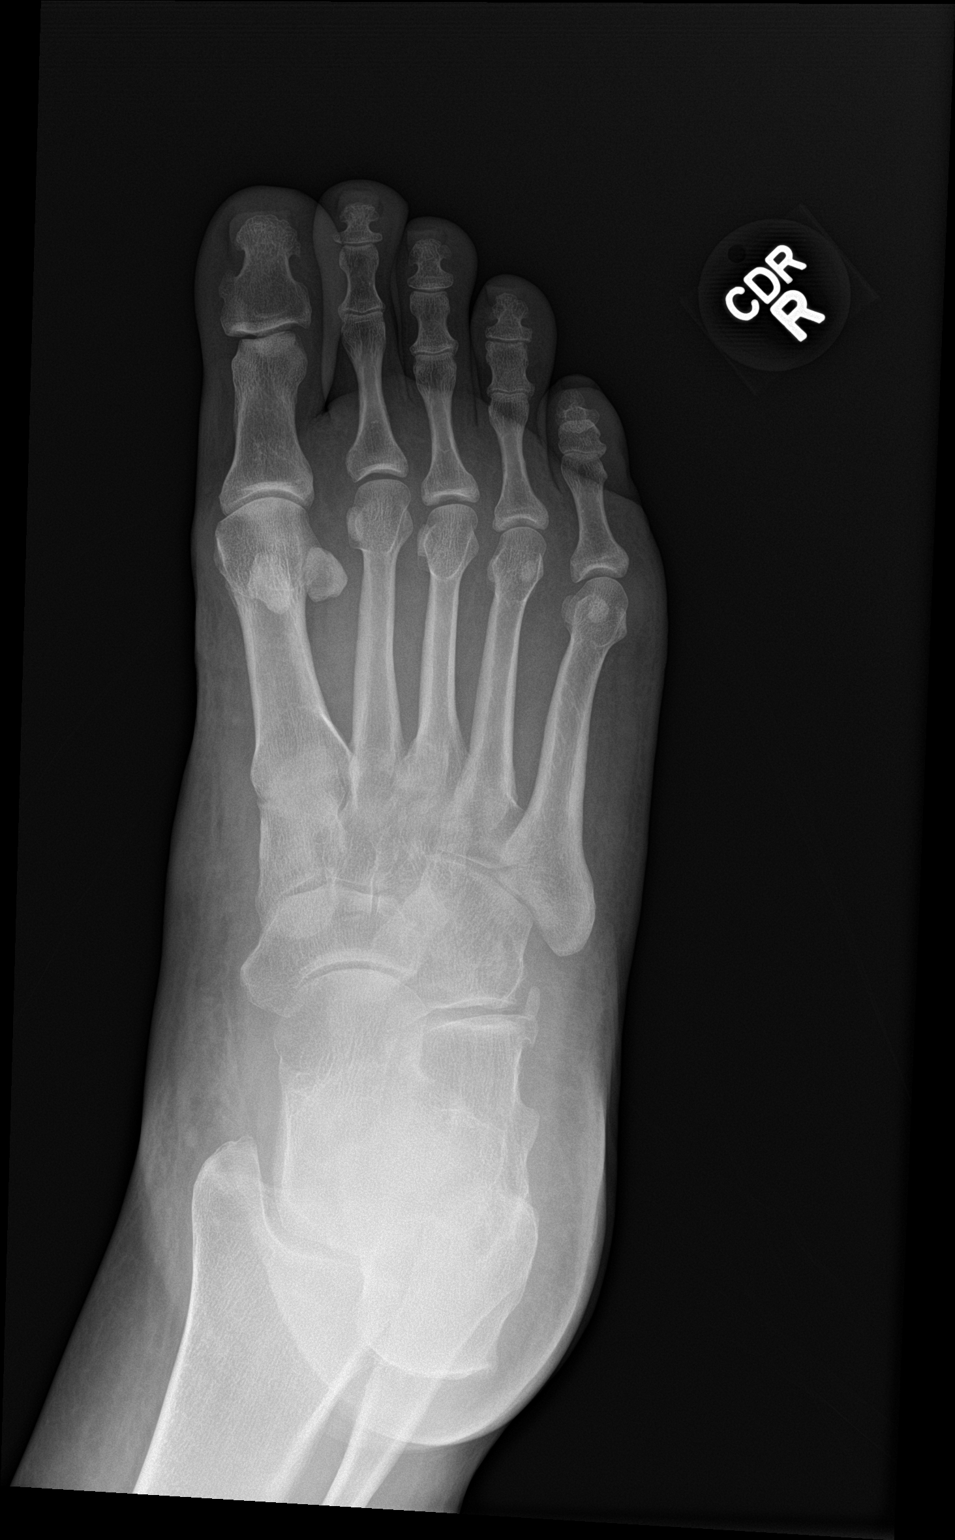

[foot lat]
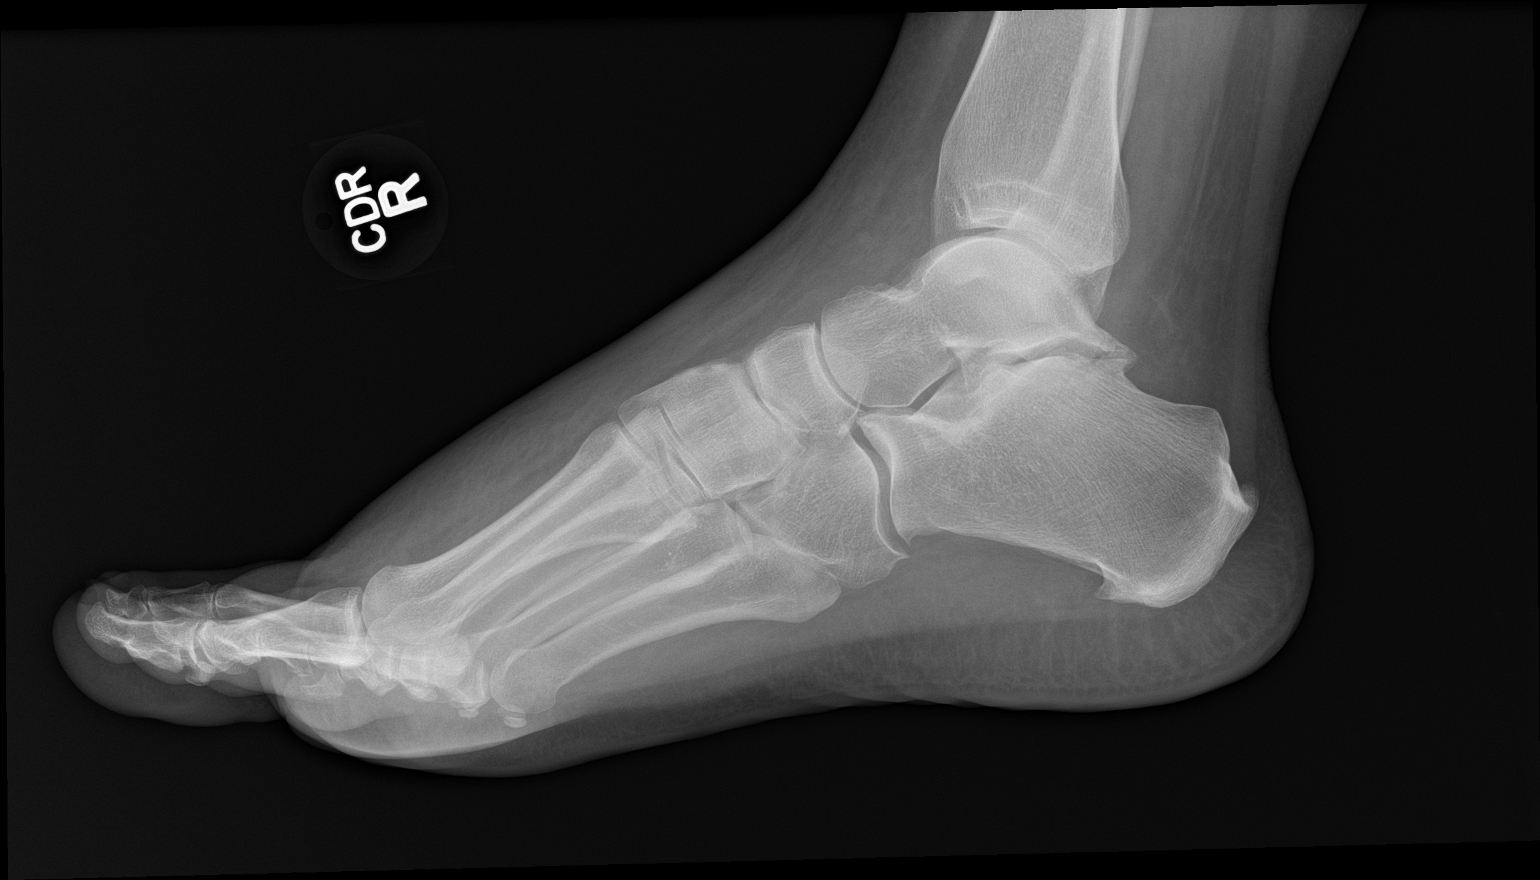

[3 of 3 positions shown; findings below may reference images not displayed]

FINDINGS: There is no evidence of fracture or dislocation. No erosion or
periosteal elevation. There is no evidence of arthropathy or other
focal bone abnormality. Diffuse soft tissue swelling. No soft tissue
gas.
IMPRESSION: 1. No acute osseous abnormality. No radiographic evidence of
osteomyelitis.
2. Diffuse soft tissue swelling. No soft tissue gas.

## 2022-02-19 MED ORDER — CLINDAMYCIN HCL 150 MG PO CAPS: 450.0000 mg | ORAL_CAPSULE | Freq: Three times a day (TID) | ORAL | 0 refills | Status: DC

## 2022-02-19 MED ORDER — ACETAMINOPHEN 500 MG PO TABS
1000.0000 mg | ORAL_TABLET | Freq: Once | ORAL | Status: AC
  Administered 2022-02-19: 1000 mg via ORAL
  Filled 2022-02-19: qty 2

## 2022-02-19 NOTE — ED Triage Notes (Signed)
Right foot abscess, reports drug use through foot .

## 2022-02-19 NOTE — ED Notes (Signed)
Pt refused lab work.

## 2022-02-19 NOTE — Discharge Instructions (Addendum)
You refused blood work on today's visit.  I have prescribed antibiotics to help treat the infection to your foot, please take 1 tablet 3 times a day for the next 5 days.  If you experience any systemic signs of nausea, fever, worsening redness you will need to return to the emergency department.

## 2022-02-19 NOTE — ED Notes (Signed)
Patient has an abscess on her left  feet . States that it came from her drug use. States that it has been swollen since Friday. It is red in color.

## 2022-02-19 NOTE — ED Provider Notes (Signed)
[facility] EMERGENCY DEPARTMENT Provider Note   CSN: [id number] Arrival date & time: 02/19/22  1747     History Chief Complaint  Patient presents with   Abscess    Christina Arnold is a 31 y.o. female.  31 y.o female with a PMH of IV drug use, HTN presents to the ED with a chief complaint of right foot abscess x 2 days. 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 This has worsen in the past couple of days. No MAT. No fever.   The history is provided by the patient.  Abscess Location:  Foot Foot abscess location:  Top of L foot Size:  1 Abscess quality: induration and warmth   Red streaking: no   Duration:  2 days Progression:  Worsening Chronicity:  Recurrent Context: injected drug use   Relieved by:  Nothing Worsened by:  Nothing Ineffective treatments:  None tried Associated symptoms: no fever, no nausea and no vomiting   Risk factors: prior abscess       Home Medications Prior to Admission medications   Medication Sig Start Date End Date Taking? Authorizing Provider  clindamycin (CLEOCIN) 150 MG capsule Take 3 capsules (450 mg total) by mouth 3 (three) times daily for 5 days. 02/19/22 02/24/22 Yes Augusten Lipkin, PA-C  aluminum-magnesium hydroxide 200-200 MG/5ML suspension Take 10 mLs by mouth every 6 (six) hours as needed for indigestion. 10/26/14   Oswaldo Conroy, PA-C  azithromycin (ZITHROMAX Z-PAK) 250 MG tablet Take 1 tablet (250 mg total) by mouth daily. 09/05/15   Elpidio Anis, PA-C  cloNIDine (CATAPRES) 0.1 MG tablet Take 2 tablets (0.2 mg total) by mouth 2 (two) times daily. 09/25/15   Melton Krebs, PA-C  ibuprofen (ADVIL,MOTRIN) 800 MG tablet Take 1 tablet (800 mg total) by mouth 3 (three) times daily. 09/05/15   Elpidio Anis, PA-C  promethazine (PHENERGAN) 25 MG tablet Take 1 tablet (25 mg total) by mouth every 6 (six) hours as needed for nausea or vomiting. 09/25/15   Melton Krebs,  PA-C      Allergies    Amoxicillin    Review of Systems   Review of Systems  Constitutional:  Negative for fever.  HENT:  Negative for sore throat.   Respiratory:  Negative for shortness of breath.   Cardiovascular:  Negative for chest pain.  Gastrointestinal:  Negative for abdominal pain, nausea and vomiting.  Genitourinary:  Negative for flank pain.  Musculoskeletal:  Negative for arthralgias.  Neurological:  Negative for light-headedness.  All other systems reviewed and are negative.  Physical Exam Updated Vital Signs BP 127/73 (BP Location: Left Arm)   Pulse 93   Temp 98.4 F (36.9 C) (Oral)   Resp 17   Ht 5\' 9"  (1.753 m)   Wt 104.3 kg   LMP  (Exact Date)   SpO2 100%   BMI 33.97 kg/m  Physical Exam Vitals and nursing note reviewed.  Constitutional:      Appearance: Normal appearance. She is obese.  HENT:     Head: Normocephalic and atraumatic.  Eyes:     Pupils: Pupils are equal, round, and reactive to light.  Cardiovascular:     Rate and Rhythm: Normal rate.     Pulses:          Dorsalis pedis pulses are 2+ on the right side.       Posterior tibial pulses are 2+ on the right side.  Pulmonary:  Effort: Pulmonary effort is normal.  Abdominal:     General: Abdomen is flat.  Musculoskeletal:     Cervical back: Normal range of motion and neck supple.     Right lower leg: No edema.  Skin:    General: Skin is warm.     Findings: Erythema present.     Comments: 0.5 cm erythematous, without any fluctuance but induration noted along the medial dorsum aspect.  No streaking. No swelling along ankle joint.   Neurological:     Mental Status: She is alert and oriented to person, place, and time.    ED Results / Procedures / Treatments   Labs (all labs ordered are listed, but only abnormal results are displayed) Labs Reviewed  CBC WITH DIFFERENTIAL/PLATELET  COMPREHENSIVE METABOLIC PANEL  LACTIC ACID, PLASMA  LACTIC ACID, PLASMA  CORD BLOOD GAS (ARTERIAL)     EKG None  Radiology DG Foot Complete Right  Result Date: 02/19/2022 CLINICAL DATA:  Right foot pain and redness EXAM: RIGHT FOOT COMPLETE - 3+ VIEW COMPARISON:  None Available. FINDINGS: There is no evidence of fracture or dislocation. No erosion or periosteal elevation. There is no evidence of arthropathy or other focal bone abnormality. Diffuse soft tissue swelling. No soft tissue gas. IMPRESSION: 1. No acute osseous abnormality. No radiographic evidence of osteomyelitis. 2. Diffuse soft tissue swelling. No soft tissue gas. Electronically Signed   By: Duanne GuessNicholas  Plundo D.O.   On: 02/19/2022 19:14    Procedures Procedures    Medications Ordered in ED Medications  acetaminophen (TYLENOL) tablet 1,000 mg (1,000 mg Oral Given 02/19/22 1828)    ED Course/ Medical Decision Making/ A&P                           Medical Decision Making Amount and/or Complexity of Data Reviewed Labs: ordered. Radiology: ordered.  Risk OTC drugs. Prescription drug management.   Patient here with right foot abscess x2 days. IV drug use with heroin last time two days ago. Worsening redness and pain to the area. No MAT. No systemic signs.   History obtained to rule out any osteomyelitis, she is without systemic signs on today's visit.  X-ray ordered and evaluated interpreted by me without any acute finding.  No palpable fluctuance order to be drained on today's visit.  Some induration noted however this is very minimal, erythema likely with a superficial cellulitis at this time.  I did try to get blood work from her to check for any systemic infection however patient is a very difficult stick and we were unable to obtain blood.  After this patient is declining any blood work on today's visit, she has been taking clindamycin at home, reports this does improve her pain.  She is requesting antibiotics at this time.  I do feel that patient can be treated with p.o. antibiotics at this time.  I did decline to  perform I&D as wound is located on the dorsum aspect, do not feel that there is much the need to be drained, she has been poking at this at home.  She is here afebrile, she is agreeable with treatment at this time.  We will place her on a 5 course of clindamycin.  Patient understands and agrees to management, strict precautions discussed at length.    Portions of this note were generated with Scientist, clinical (histocompatibility and immunogenetics)Dragon dictation software. Dictation errors may occur despite best attempts at proofreading.   Final Clinical Impression(s) / ED Diagnoses Final  diagnoses:  Foot abscess    Rx / DC Orders ED Discharge Orders          Ordered    clindamycin (CLEOCIN) 150 MG capsule  3 times daily        02/19/22 1920              Claude Manges, PA-C 02/19/22 1924    Alvira Monday, MD 02/20/22 [phone]

## 2022-02-23 ENCOUNTER — Emergency Department (HOSPITAL_COMMUNITY): Payer: Medicaid Other

## 2022-02-23 ENCOUNTER — Other Ambulatory Visit: Payer: Self-pay

## 2022-02-23 ENCOUNTER — Encounter (HOSPITAL_COMMUNITY): Payer: Self-pay

## 2022-02-23 ENCOUNTER — Observation Stay (HOSPITAL_COMMUNITY)
Admission: EM | Admit: 2022-02-23 | Discharge: 2022-02-24 | Discharge status: Against Medical Advice | Payer: Medicaid Other | Attending: Internal Medicine | Admitting: Internal Medicine

## 2022-02-23 DIAGNOSIS — Z79899 Other long term (current) drug therapy: Secondary | ICD-10-CM | POA: Insufficient documentation

## 2022-02-23 DIAGNOSIS — F319 Bipolar disorder, unspecified: Secondary | ICD-10-CM | POA: Diagnosis not present

## 2022-02-23 DIAGNOSIS — F1190 Opioid use, unspecified, uncomplicated: Secondary | ICD-10-CM | POA: Diagnosis not present

## 2022-02-23 DIAGNOSIS — F1120 Opioid dependence, uncomplicated: Secondary | ICD-10-CM | POA: Diagnosis present

## 2022-02-23 DIAGNOSIS — L03115 Cellulitis of right lower limb: Secondary | ICD-10-CM | POA: Diagnosis not present

## 2022-02-23 DIAGNOSIS — R739 Hyperglycemia, unspecified: Secondary | ICD-10-CM | POA: Diagnosis not present

## 2022-02-23 DIAGNOSIS — M7989 Other specified soft tissue disorders: Secondary | ICD-10-CM | POA: Diagnosis present

## 2022-02-23 HISTORY — DX: Attention-deficit hyperactivity disorder, unspecified type: F90.9

## 2022-02-23 HISTORY — DX: Anxiety disorder, unspecified: F41.9

## 2022-02-23 HISTORY — DX: Depression, unspecified: F32.A

## 2022-02-23 HISTORY — DX: Bipolar disorder, unspecified: F31.9

## 2022-02-23 LAB — COMPREHENSIVE METABOLIC PANEL
ALT: 15 U/L (ref 0–44)
AST: 16 U/L (ref 15–41)
Albumin: 3.9 g/dL (ref 3.5–5.0)
Alkaline Phosphatase: 41 U/L (ref 38–126)
Anion gap: 9 (ref 5–15)
BUN: 11 mg/dL (ref 6–20)
CO2: 27 mmol/L (ref 22–32)
Calcium: 9.6 mg/dL (ref 8.9–10.3)
Chloride: 105 mmol/L (ref 98–111)
Creatinine, Ser: 0.7 mg/dL (ref 0.44–1.00)
GFR, Estimated: >60 mL/min (ref >60)
Glucose, Bld: 113 mg/dL — ABNORMAL HIGH (ref 70–99)
Potassium: 3.6 mmol/L (ref 3.5–5.1)
Sodium: 141 mmol/L (ref 135–145)
Total Bilirubin: 0.6 mg/dL (ref 0.3–1.2)
Total Protein: 8.3 g/dL — ABNORMAL HIGH (ref 6.5–8.1)

## 2022-02-23 LAB — RAPID URINE DRUG SCREEN, HOSP PERFORMED
Amphetamines: NOT DETECTED
Barbiturates: NOT DETECTED
Benzodiazepines: NOT DETECTED
Cocaine: NOT DETECTED
Opiates: NOT DETECTED
Tetrahydrocannabinol: NOT DETECTED

## 2022-02-23 LAB — CBC
HCT: 37.1 % (ref 36.0–46.0)
Hemoglobin: 12.3 g/dL (ref 12.0–15.0)
MCH: 27.8 pg (ref 26.0–34.0)
MCHC: 33.2 g/dL (ref 30.0–36.0)
MCV: 83.7 fL (ref 80.0–100.0)
Platelets: 254 10*3/uL (ref 150–400)
RBC: 4.43 MIL/uL (ref 3.87–5.11)
RDW: 13.7 % (ref 11.5–15.5)
WBC: 5.3 10*3/uL (ref 4.0–10.5)
nRBC: 0 % (ref 0.0–0.2)

## 2022-02-23 LAB — SEDIMENTATION RATE: Sed Rate: 61 mm/hr — ABNORMAL HIGH (ref 0–22)

## 2022-02-23 LAB — C-REACTIVE PROTEIN: CRP: 3.1 mg/dL — ABNORMAL HIGH (ref <1.0)

## 2022-02-23 LAB — I-STAT BETA HCG BLOOD, ED (MC, WL, AP ONLY): I-stat hCG, quantitative: <5 m[IU]/mL (ref <5)

## 2022-02-23 IMAGING — MR MR ANKLE*R* WO/W CM
9 series · 40 of 40 positions shown · IV contrast (gadavist)
Comparison: Foot radiograph [DATE]

CLINICAL DATA: Soft tissue infection suspected, ankle, xray done;
Soft tissue mass, foot, deep

EXAM:
MRI OF THE RIGHT ANKLE WITHOUT AND WITH CONTRAST; MRI OF THE RIGHT
FOREFOOT WITHOUT AND WITH CONTRAST
TECHNIQUE: Multiplanar, multisequence MR imaging of the right ankle and foot
was performed before and after the administration of intravenous
contrast.
CONTRAST:  10mL GADAVIST GADOBUTROL 1 MMOL/ML IV SOLN

[Series 11: T1 · axial · right · 3.0mm · 0.62mm/px · z∈[-66,+76]mm · 6 of 41 slices shown (1 of 2)]
[im 1/41]
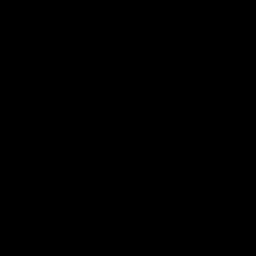
[im 9/41]
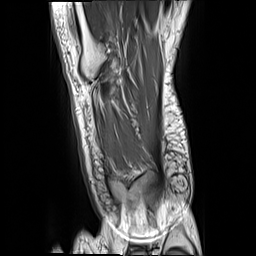
[im 17/41]
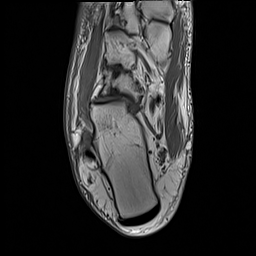
[im 25/41]
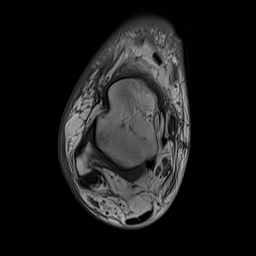
[im 33/41]
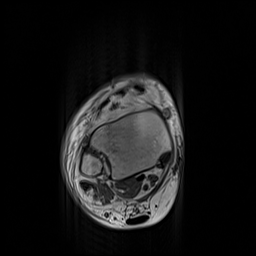
[im 41/41]
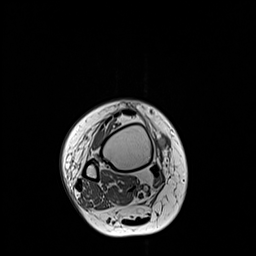

[Series 12: T2 fat-sat · axial · right · 3.0mm · 0.50mm/px · z∈[-66,+76]mm · 6 of 41 slices shown (1 of 2)]
[im 1/41]
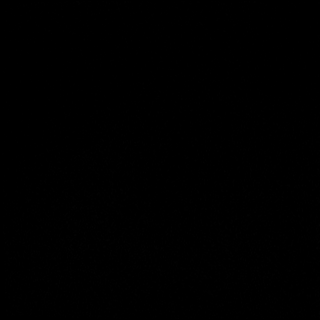
[im 9/41]
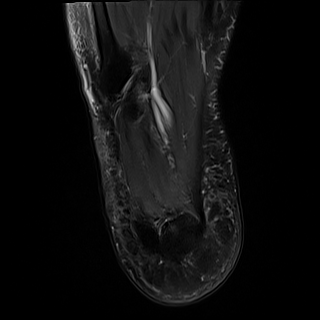
[im 17/41]
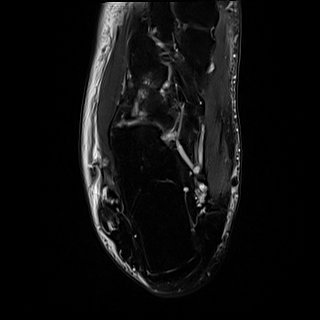
[im 25/41]
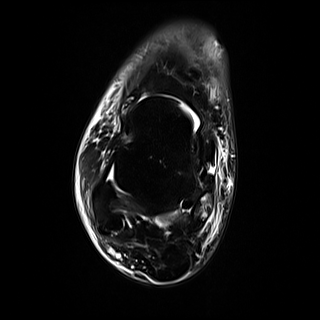
[im 33/41]
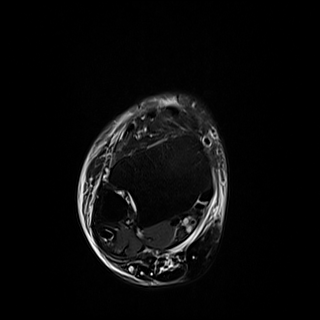
[im 41/41]
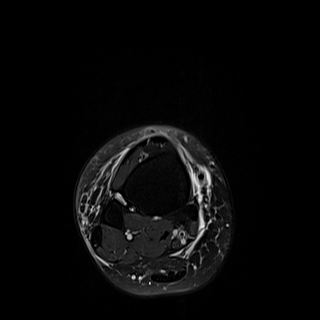

[Series 13: T2 fat-sat · coronal · right · 3.0mm · 0.70mm/px · 4 of 37 slices shown (2 of 2)]
[im 1/37]
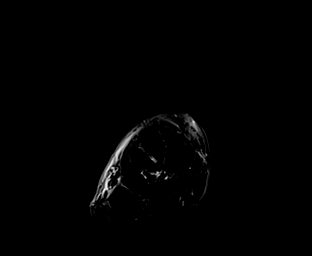
[im 13/37]
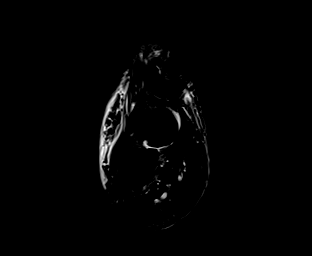
[im 25/37]
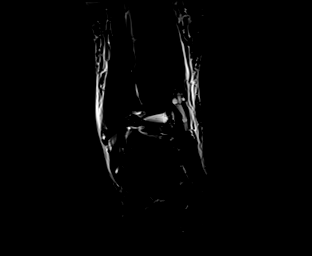
[im 37/37]
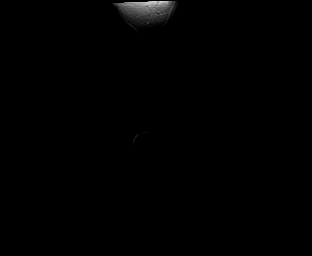

[Series 14: T1 · coronal · right · 3.0mm · 0.70mm/px · 4 of 37 slices shown (2 of 2)]
[im 1/37]
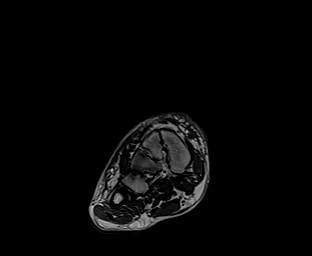
[im 13/37]
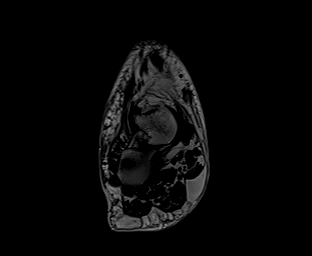
[im 25/37]
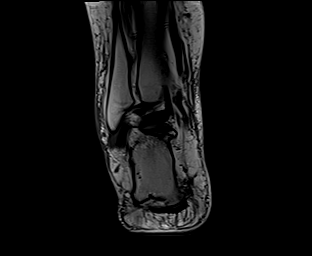
[im 37/37]
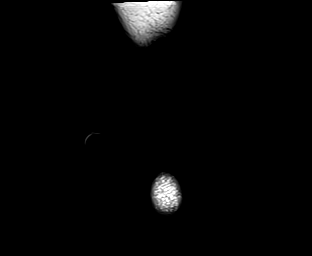

[Series 15: STIR · sagittal · right · 3.0mm · 0.35mm/px · 3 of 27 slices shown]
[im 1/27]
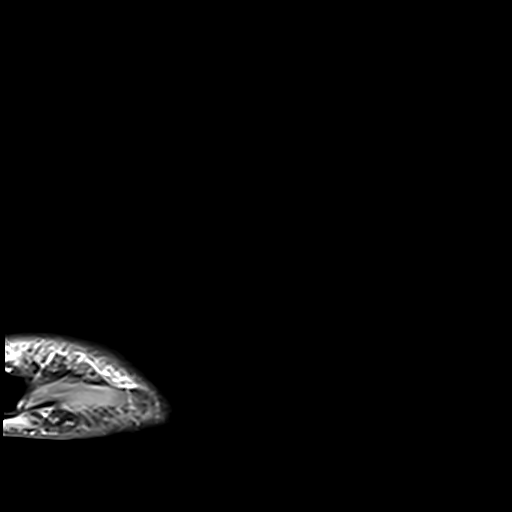
[im 14/27]
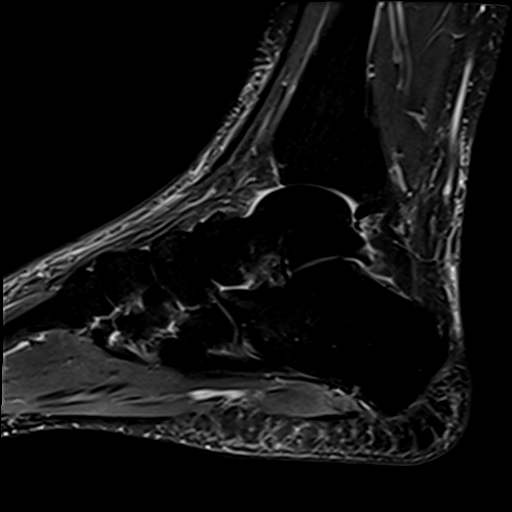
[im 27/27]
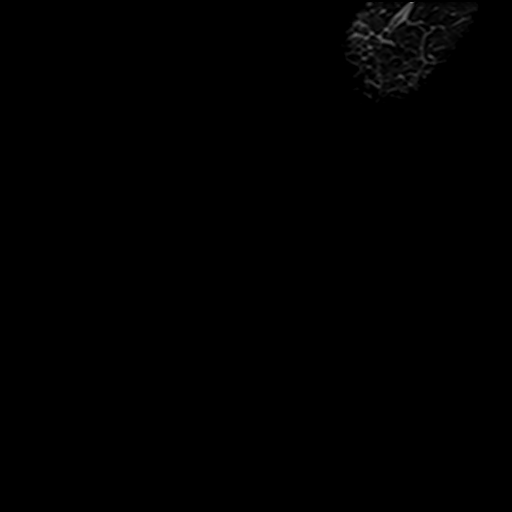

[Series 16: T1 fat-sat · axial · non-contrast · right · 3.0mm · 0.62mm/px · z∈[-66,+76]mm · 5 of 41 slices shown]
[im 1/41]
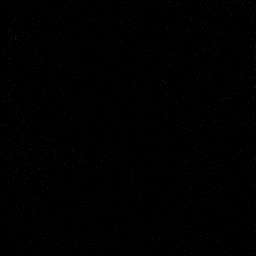
[im 11/41]
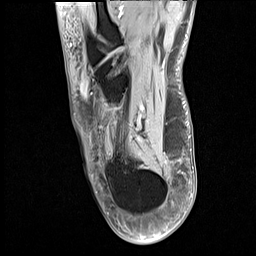
[im 21/41]
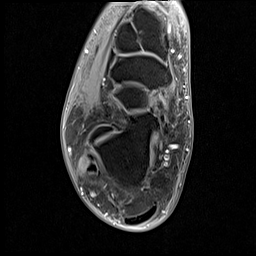
[im 31/41]
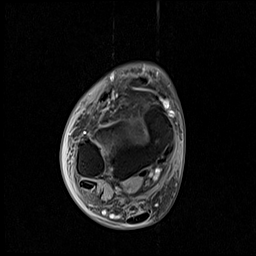
[im 41/41]
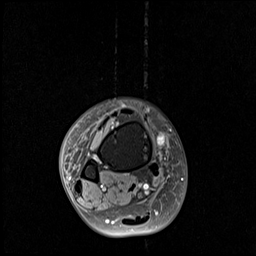

[Series 17: T1 fat-sat post-contrast · axial · right · 3.0mm · 0.62mm/px · z∈[-66,+76]mm · 5 of 41 slices shown (1 of 3)]
[im 1/41]
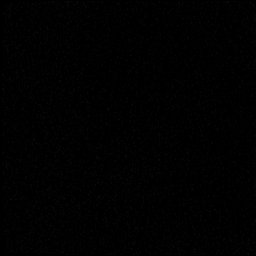
[im 11/41]
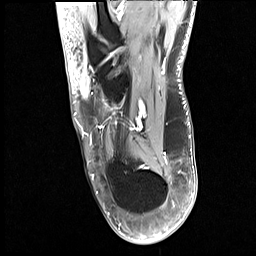
[im 21/41]
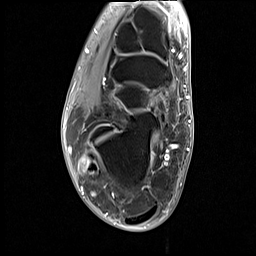
[im 31/41]
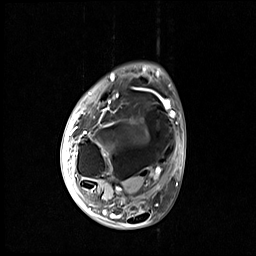
[im 41/41]
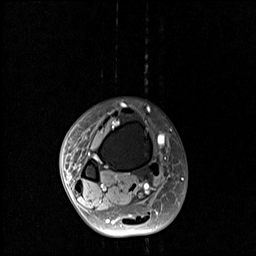

[Series 18: T1 fat-sat post-contrast · sagittal · right · 3.0mm · 0.35mm/px · 3 of 27 slices shown (2 of 3)]
[im 1/27]
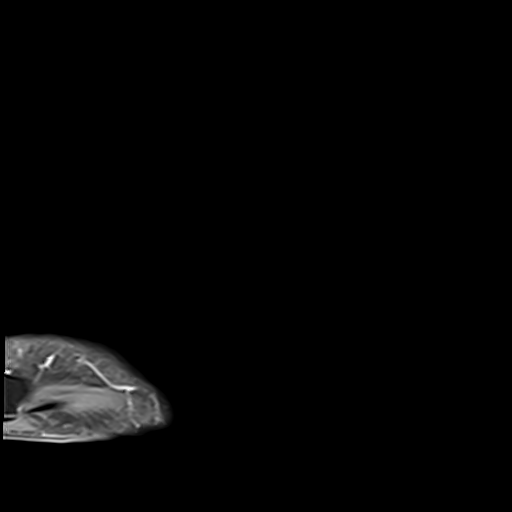
[im 14/27]
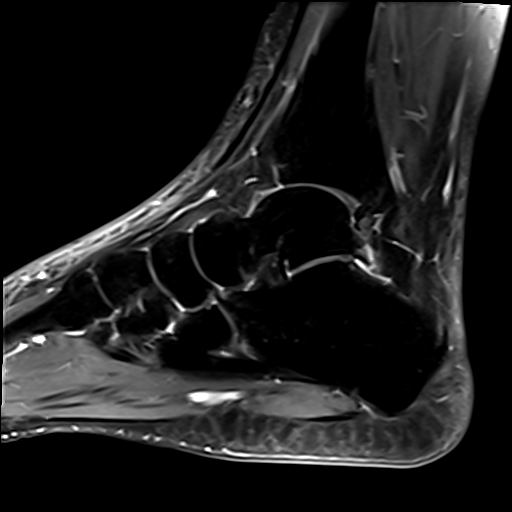
[im 27/27]
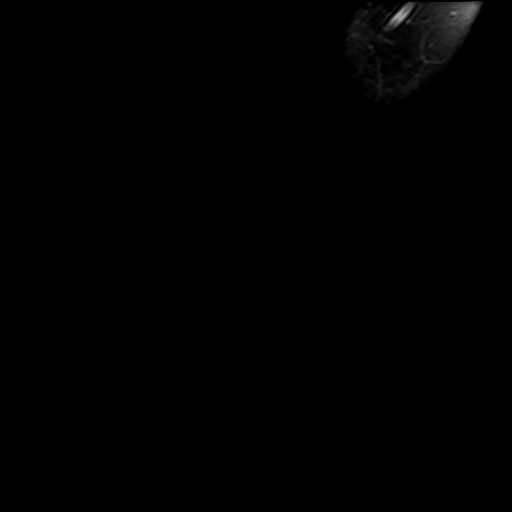

[Series 19: T1 fat-sat post-contrast · coronal · right · 3.0mm · 0.56mm/px · 4 of 37 slices shown (3 of 3)]
[im 1/37]
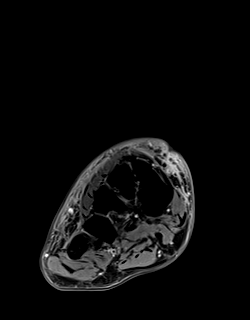
[im 13/37]
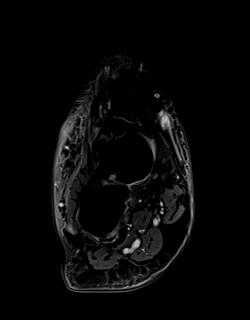
[im 25/37]
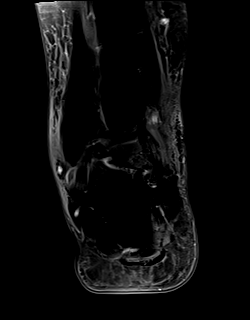
[im 37/37]
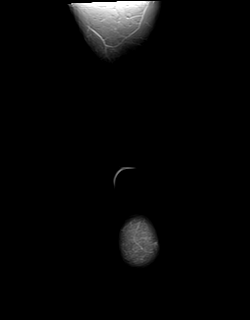

[40 of 40 positions shown; findings below may reference images not displayed]

FINDINGS: MRI ANKLE

TENDONS

Peroneal: There is a long segment split peroneus brevis tear at and
distal to the lateral malleolus with tendinosis and partial tearing
distally through its attachment on the base of the fifth metatarsal.
Intact peroneal longus tendon.

Posteromedial: Mild tenosynovitis of the inframalleolar posterior
tibial tendon. No PT tendon tear. Intact flexor hallucis longus and
flexor digitorum longus tendons.

Anterior: Intact tibialis anterior, extensor hallucis longus and
extensor digitorum longus tendons.

Achilles: Mild peritendinitis of the distal Achilles tendon. No
Achilles tendon tear. Mild adjacent edema in Kager's fat pad.

Plantar Fascia: Intact.

LIGAMENTS

Lateral: Anterior talofibular ligament intact. Calcaneofibular
ligament intact. Posterior talofibular ligament intact. Anterior and
posterior tibiofibular ligaments intact.

Medial: Deltoid ligament intact. Spring ligament intact.

CARTILAGE

Ankle Joint: No significant joint effusion. Normal ankle mortise. No
chondral defect.

Subtalar Joints/Sinus Tarsi: Normal subtalar joints. No significant
subtalar joint effusion. Normal sinus tarsi.

Bones: There is mild calcaneocuboid and navicular-lateral cuneiform
osteoarthritis with subchondral marrow edema. There is no other
significant marrow signal alteration.

Soft Tissue: There is generalized soft tissue swelling there is a
dorsal medial midfoot wound with adjacent soft tissue swelling and
enhancement. There is no focal/drainable fluid collection.

MRI FOOT

Bones/Joint/Cartilage

No evidence of fracture. There is navicular-lateral cuneiform
osteoarthritis with underlying marrow edema. No other significant
marrow signal alteration in the midfoot or forefoot.

Ligaments

Intact MTP collateral ligaments. No evidence of plantar plate tear.
Intact Lisfranc ligament.

Muscles and Tendons
No significant muscle edema or muscle atrophy. No acute tendon tear.

Soft tissue
There is dorsal soft tissue swelling. There is a dorsal medial
midfoot wound with adjacent soft tissue swelling and enhancement but
no well-defined/drainable collection. No evidence of intermetatarsal
neuroma. There is mild intermetatarsal bursitis in the third
webspace between the third and fourth metatarsal heads.
IMPRESSION: IMPRESSION
Dorsal medial midfoot wound with adjacent soft tissue swelling and
enhancement. No evidence of soft tissue abscess or osteomyelitis.
Ankle and dorsal foot soft tissue swelling, can be seen in
cellulitis.

Long segment split peroneus brevis tear at and distal to the lateral
malleolus with tendinosis and partial tearing distally through its
attachment on the base of the fifth metatarsal.

Mild tenosynovitis of the inframalleolar posterior tibial tendon,
likely reactive.

Mild calcaneocuboid and navicular-lateral cuneiform osteoarthritis.

## 2022-02-23 IMAGING — MR MR FOOT*R* WO/W CM
9 series · 40 of 40 positions shown · IV contrast (gadavist)
Comparison: Foot radiograph [DATE]

CLINICAL DATA: Soft tissue infection suspected, ankle, xray done;
Soft tissue mass, foot, deep

EXAM:
MRI OF THE RIGHT ANKLE WITHOUT AND WITH CONTRAST; MRI OF THE RIGHT
FOREFOOT WITHOUT AND WITH CONTRAST
TECHNIQUE: Multiplanar, multisequence MR imaging of the right ankle and foot
was performed before and after the administration of intravenous
contrast.
CONTRAST:  10mL GADAVIST GADOBUTROL 1 MMOL/ML IV SOLN

[Series 3: T1 · coronal · right · 3.0mm · 0.47mm/px · 5 of 47 slices shown (1 of 2)]
[im 1/47]
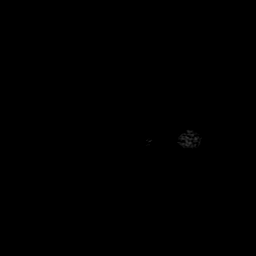
[im 12/47]
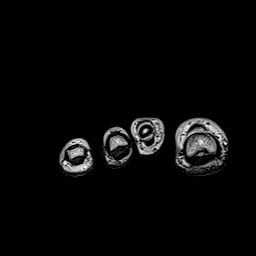
[im 24/47]
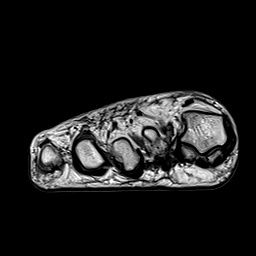
[im 35/47]
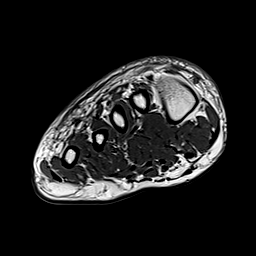
[im 47/47]
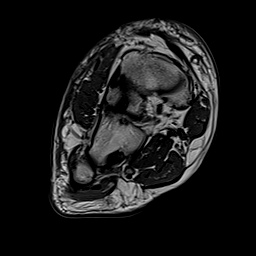

[Series 4: T2 fat-sat · coronal · right · 3.0mm · 0.38mm/px · 6 of 47 slices shown (1 of 2)]
[im 1/47]
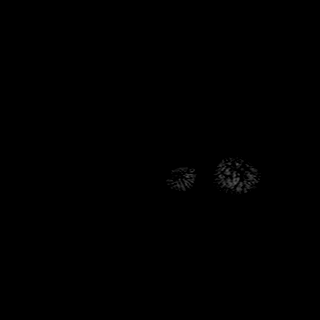
[im 10/47]
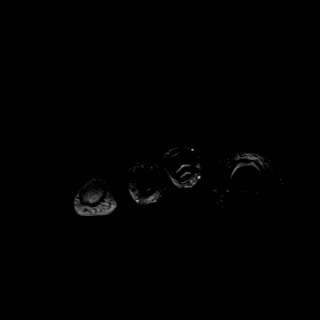
[im 19/47]
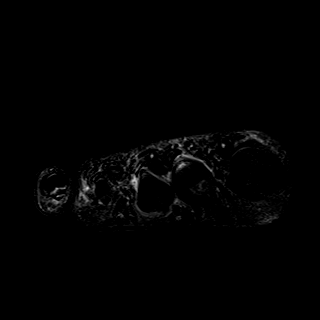
[im 28/47]
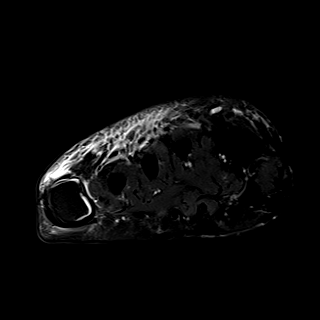
[im 37/47]
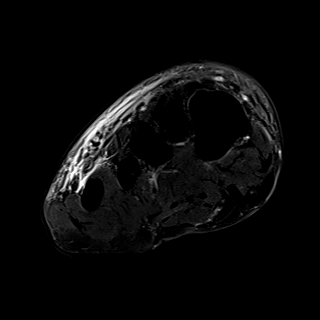
[im 47/47]
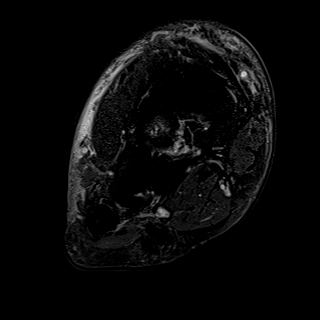

[Series 5: T2 fat-sat · axial · right · 3.0mm · 0.70mm/px · z∈[-112,-27]mm · 3 of 26 slices shown (2 of 2)]
[im 1/26]
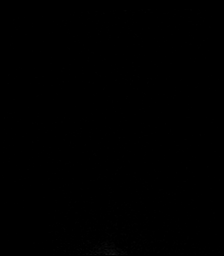
[im 13/26]
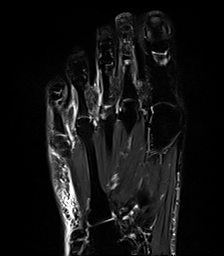
[im 26/26]
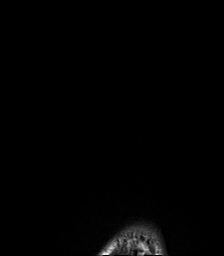

[Series 6: T1 · axial · right · 3.0mm · 0.70mm/px · z∈[-111,-26]mm · 3 of 26 slices shown (2 of 2)]
[im 1/26]
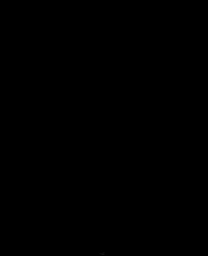
[im 13/26]
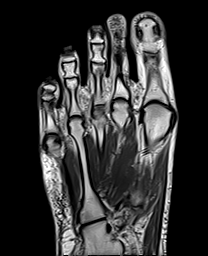
[im 26/26]
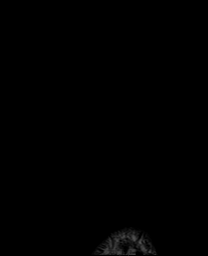

[Series 7: STIR · sagittal · right · 3.0mm · 0.35mm/px · 4 of 30 slices shown]
[im 1/30]
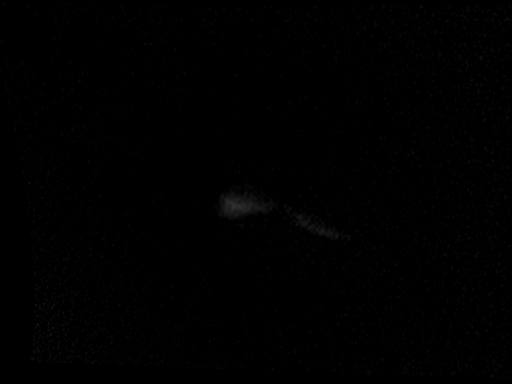
[im 10/30]
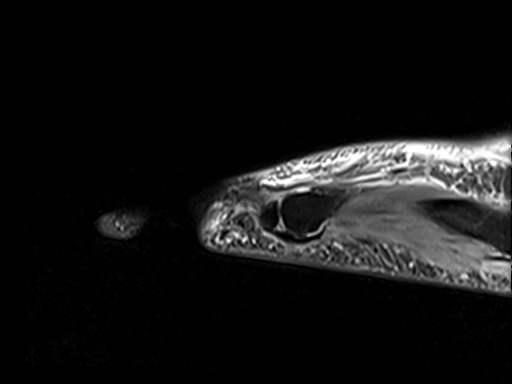
[im 20/30]
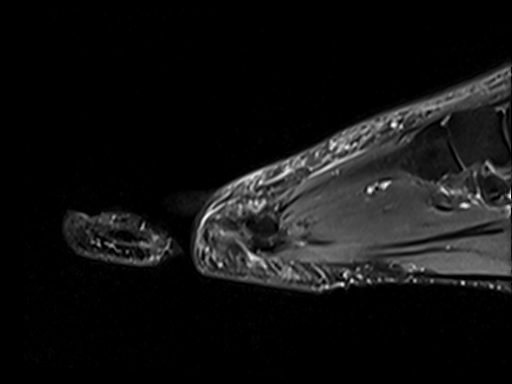
[im 30/30]
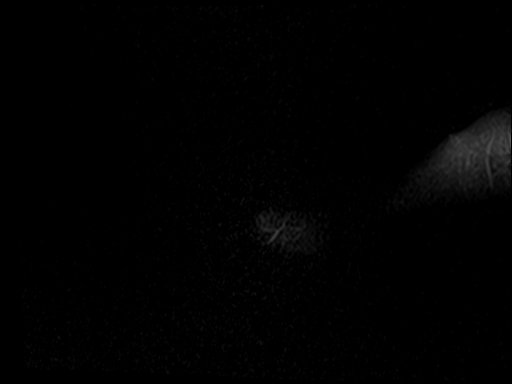

[Series 8: T1 fat-sat · coronal · non-contrast · right · 3.0mm · 0.47mm/px · 6 of 47 slices shown]
[im 1/47]
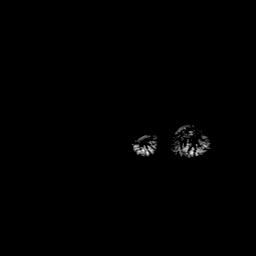
[im 10/47]
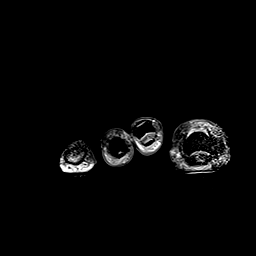
[im 19/47]
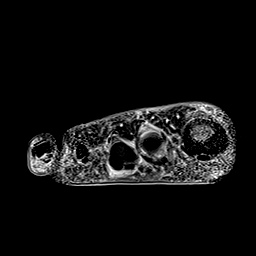
[im 28/47]
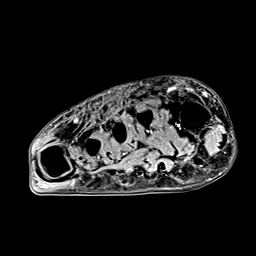
[im 37/47]
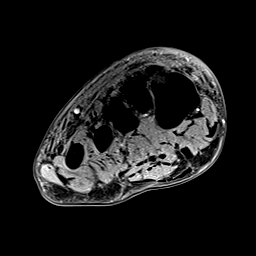
[im 47/47]
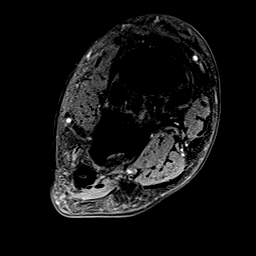

[Series 19: T1 fat-sat post-contrast · coronal · right · 3.0mm · 0.47mm/px · 6 of 47 slices shown (1 of 3)]
[im 1/47]
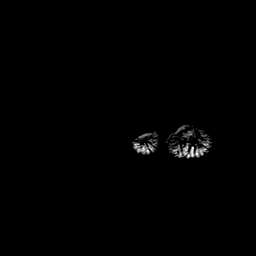
[im 10/47]
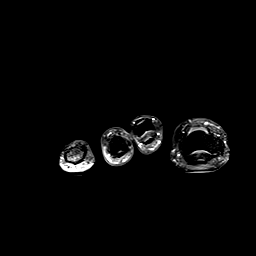
[im 19/47]
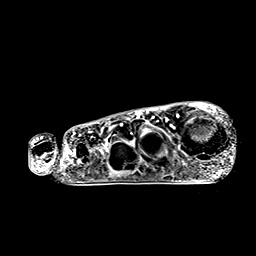
[im 28/47]
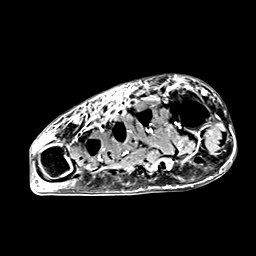
[im 37/47]
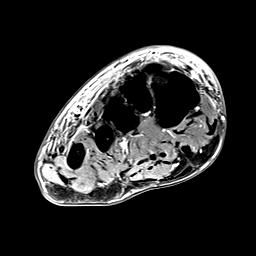
[im 47/47]
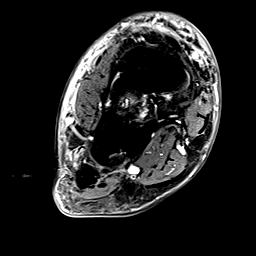

[Series 20: T1 fat-sat post-contrast · sagittal · right · 3.0mm · 0.35mm/px · 4 of 30 slices shown (2 of 3)]
[im 1/30]
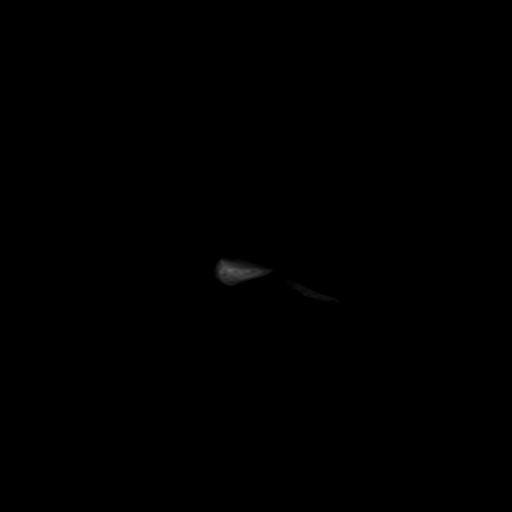
[im 10/30]
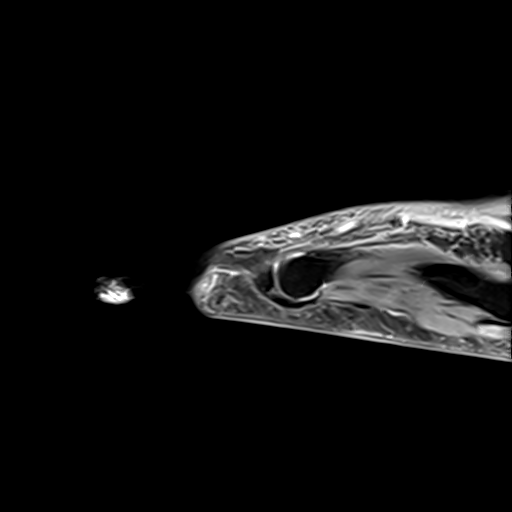
[im 20/30]
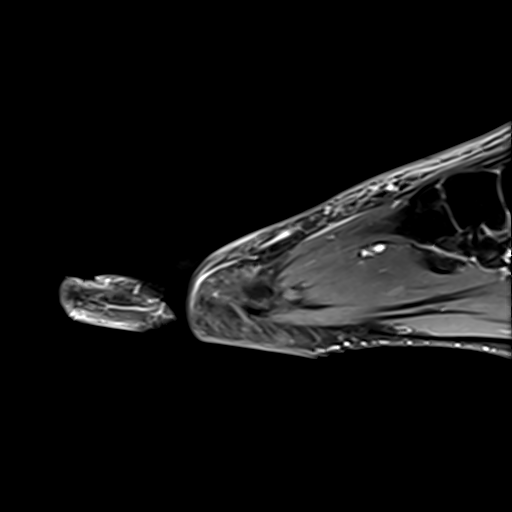
[im 30/30]
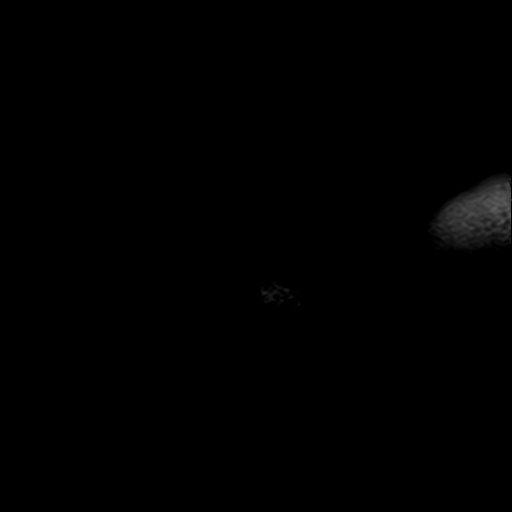

[Series 21: T1 fat-sat post-contrast · axial · right · 3.0mm · 0.56mm/px · z∈[-113,-29]mm · 3 of 26 slices shown (3 of 3)]
[im 1/26]
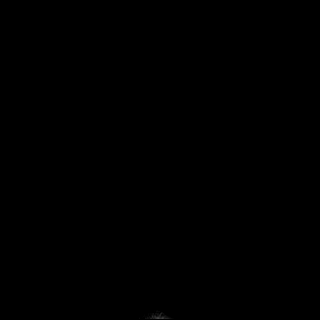
[im 13/26]
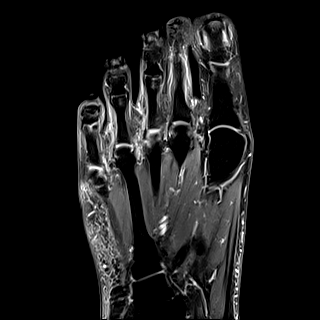
[im 26/26]
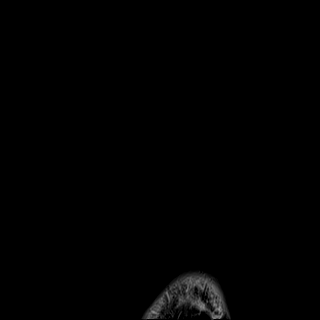

[40 of 40 positions shown; findings below may reference images not displayed]

FINDINGS: MRI ANKLE

TENDONS

Peroneal: There is a long segment split peroneus brevis tear at and
distal to the lateral malleolus with tendinosis and partial tearing
distally through its attachment on the base of the fifth metatarsal.
Intact peroneal longus tendon.

Posteromedial: Mild tenosynovitis of the inframalleolar posterior
tibial tendon. No PT tendon tear. Intact flexor hallucis longus and
flexor digitorum longus tendons.

Anterior: Intact tibialis anterior, extensor hallucis longus and
extensor digitorum longus tendons.

Achilles: Mild peritendinitis of the distal Achilles tendon. No
Achilles tendon tear. Mild adjacent edema in Kager's fat pad.

Plantar Fascia: Intact.

LIGAMENTS

Lateral: Anterior talofibular ligament intact. Calcaneofibular
ligament intact. Posterior talofibular ligament intact. Anterior and
posterior tibiofibular ligaments intact.

Medial: Deltoid ligament intact. Spring ligament intact.

CARTILAGE

Ankle Joint: No significant joint effusion. Normal ankle mortise. No
chondral defect.

Subtalar Joints/Sinus Tarsi: Normal subtalar joints. No significant
subtalar joint effusion. Normal sinus tarsi.

Bones: There is mild calcaneocuboid and navicular-lateral cuneiform
osteoarthritis with subchondral marrow edema. There is no other
significant marrow signal alteration.

Soft Tissue: There is generalized soft tissue swelling there is a
dorsal medial midfoot wound with adjacent soft tissue swelling and
enhancement. There is no focal/drainable fluid collection.

MRI FOOT

Bones/Joint/Cartilage

No evidence of fracture. There is navicular-lateral cuneiform
osteoarthritis with underlying marrow edema. No other significant
marrow signal alteration in the midfoot or forefoot.

Ligaments

Intact MTP collateral ligaments. No evidence of plantar plate tear.
Intact Lisfranc ligament.

Muscles and Tendons
No significant muscle edema or muscle atrophy. No acute tendon tear.

Soft tissue
There is dorsal soft tissue swelling. There is a dorsal medial
midfoot wound with adjacent soft tissue swelling and enhancement but
no well-defined/drainable collection. No evidence of intermetatarsal
neuroma. There is mild intermetatarsal bursitis in the third
webspace between the third and fourth metatarsal heads.
IMPRESSION: IMPRESSION
Dorsal medial midfoot wound with adjacent soft tissue swelling and
enhancement. No evidence of soft tissue abscess or osteomyelitis.
Ankle and dorsal foot soft tissue swelling, can be seen in
cellulitis.

Long segment split peroneus brevis tear at and distal to the lateral
malleolus with tendinosis and partial tearing distally through its
attachment on the base of the fifth metatarsal.

Mild tenosynovitis of the inframalleolar posterior tibial tendon,
likely reactive.

Mild calcaneocuboid and navicular-lateral cuneiform osteoarthritis.

## 2022-02-23 MED ORDER — GADOBUTROL 1 MMOL/ML IV SOLN
10.0000 mL | Freq: Once | INTRAVENOUS | Status: AC | PRN
  Administered 2022-02-23: 10 mL via INTRAVENOUS

## 2022-02-23 MED ORDER — SODIUM CHLORIDE 0.9 % IV SOLN
2.0000 g | Freq: Once | INTRAVENOUS | Status: AC
  Administered 2022-02-23: 2 g via INTRAVENOUS
  Filled 2022-02-23: qty 20

## 2022-02-23 MED ORDER — ACETAMINOPHEN 650 MG RE SUPP: 650.0000 mg | Freq: Four times a day (QID) | RECTAL | Status: DC | PRN

## 2022-02-23 MED ORDER — SODIUM CHLORIDE 0.9 % IV SOLN: 1.0000 g | Freq: Once | INTRAVENOUS | Status: DC

## 2022-02-23 MED ORDER — DOXYCYCLINE HYCLATE 100 MG PO TABS
100.0000 mg | ORAL_TABLET | Freq: Two times a day (BID) | ORAL | Status: DC
  Administered 2022-02-23: 100 mg via ORAL
  Filled 2022-02-23 (×2): qty 1

## 2022-02-23 MED ORDER — OXYCODONE HCL 5 MG PO TABS
5.0000 mg | ORAL_TABLET | ORAL | Status: DC | PRN
  Administered 2022-02-24: 5 mg via ORAL
  Filled 2022-02-23: qty 1

## 2022-02-23 MED ORDER — SENNA 8.6 MG PO TABS
1.0000 | ORAL_TABLET | Freq: Two times a day (BID) | ORAL | Status: DC
  Filled 2022-02-23: qty 1

## 2022-02-23 MED ORDER — ONDANSETRON HCL 4 MG/2ML IJ SOLN: 4.0000 mg | Freq: Four times a day (QID) | INTRAMUSCULAR | Status: DC | PRN

## 2022-02-23 MED ORDER — TRIPLE ANTIBIOTIC 3.5-400-5000 EX OINT
TOPICAL_OINTMENT | Freq: Two times a day (BID) | CUTANEOUS | Status: DC
  Filled 2022-02-23: qty 1

## 2022-02-23 MED ORDER — ACETAMINOPHEN 325 MG PO TABS
650.0000 mg | ORAL_TABLET | Freq: Four times a day (QID) | ORAL | Status: DC | PRN
  Administered 2022-02-23 – 2022-02-24 (×2): 650 mg via ORAL
  Filled 2022-02-23 (×2): qty 2

## 2022-02-23 MED ORDER — VANCOMYCIN HCL 2000 MG/400ML IV SOLN
2000.0000 mg | Freq: Once | INTRAVENOUS | Status: AC
  Administered 2022-02-23: 2000 mg via INTRAVENOUS
  Filled 2022-02-23: qty 400

## 2022-02-23 MED ORDER — METHADONE HCL 10 MG PO TABS: 80.0000 mg | ORAL_TABLET | Freq: Every day | ORAL | Status: DC

## 2022-02-23 MED ORDER — ONDANSETRON HCL 4 MG PO TABS: 4.0000 mg | ORAL_TABLET | Freq: Four times a day (QID) | ORAL | Status: DC | PRN

## 2022-02-23 MED ORDER — DOXYCYCLINE HYCLATE 100 MG PO TABS
100.0000 mg | ORAL_TABLET | Freq: Two times a day (BID) | ORAL | Status: DC
Start: 2022-02-23 — End: 2022-02-23

## 2022-02-23 MED ORDER — SODIUM CHLORIDE 0.9 % IV SOLN: INTRAVENOUS | Status: DC

## 2022-02-23 MED ORDER — SODIUM CHLORIDE 0.9 % IV SOLN
2.0000 g | INTRAVENOUS | Status: DC
  Filled 2022-02-23: qty 20

## 2022-02-23 MED ORDER — ENOXAPARIN SODIUM 40 MG/0.4ML IJ SOSY
40.0000 mg | PREFILLED_SYRINGE | Freq: Every day | INTRAMUSCULAR | Status: DC
  Administered 2022-02-23: 40 mg via SUBCUTANEOUS
  Filled 2022-02-23: qty 0.4

## 2022-02-23 NOTE — ED Triage Notes (Signed)
Patient states she was diagnosed with cellulitis of the right foot 6 days ago. Patient went to an Ortho physician today and was sent to the ED for IV antibiotics, and an MRI.

## 2022-02-23 NOTE — H&P (Signed)
[facility] History and Physical  Christina ClanHeather Smedley ZOX:[id number] DOB: June 01, 1991 DOA: 02/23/2022   PCP: Pcp, No  Specialists: [facility]ed [facility] for [facility]one maintenance  Chief Complaint: Redness and swelling of the right foot  HPI: Christina ClanHeather Arnold is a 31 y.o. 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  This was followed by onset of redness in that area which spread over the following days.  She went to emergency department on 5/21 and she was prescribed clindamycin at that time.  She also underwent I&D at that time.  She did not notice any improvement so she went to an urgent care center a few days ago and was prescribed doxycycline.  She was referred to orthopedic surgeon.  She was seen by Dr. Odis Hollingsheadamanathan at [facility].  He sent her to the emergency department for IV antibiotics hospitalization and MRI of the right foot.  Patient currently denies any significant pain in the right foot.  The redness has not spread in the last 2 days.  Has been warm to touch but that is also improved.  Denies any nausea vomiting.  No fever or chills.  MRI foot was ordered.  Blood work was done in the emergency department.  She was given vancomycin and ceftriaxone.  Home Medications: This is an old list.  Not reconciled yet. Prior to Admission medications   Medication Sig Start Date End Date Taking? Authorizing Provider  aluminum-magnesium hydroxide 200-200 MG/5ML suspension Take 10 mLs by mouth every 6 (six) hours as needed for indigestion. 10/26/14   Oswaldo Conroyreech, Victoria, PA-C  azithromycin (ZITHROMAX Z-PAK) 250 MG tablet Take 1 tablet (250 mg total) by mouth daily. 09/05/15   Elpidio AnisUpstill, Shari, PA-C  clindamycin (CLEOCIN) 150 MG capsule Take 3 capsules (450 mg total) by mouth 3 (three) times daily for 5  days. 02/19/22 02/24/22  Claude MangesSoto, Johana, PA-C  cloNIDine (CATAPRES) 0.1 MG tablet Take 2 tablets (0.2 mg total) by mouth 2 (two) times daily. 09/25/15   Melton Krebsiley, Samantha Nicole, PA-C  ibuprofen (ADVIL,MOTRIN) 800 MG tablet Take 1 tablet (800 mg total) by mouth 3 (three) times daily. 09/05/15   Elpidio AnisUpstill, Shari, PA-C  promethazine (PHENERGAN) 25 MG tablet Take 1 tablet (25 mg total) by mouth every 6 (six) hours as needed for nausea or vomiting. 09/25/15   Melton Krebsiley, Samantha Nicole, PA-C    Allergies:  Allergies  Allergen Reactions   Amoxicillin Hives    Past Medical History: Past Medical History:  Diagnosis Date   ADHD    Anxiety    Bipolar 1 disorder (HCC)    Depression     Past Surgical History:  Procedure Laterality Date   DILATION AND CURETTAGE OF UTERUS      Social History: Denies smoking alcohol use.  Last attempt at using heroin was a week ago.  She has been using heroin on and off for the 2 weeks prior to that.  Family History: Denies any health problems in the family.  Review of Systems - History obtained from the patient General ROS: negative Psychological ROS: positive for - anxiety Ophthalmic ROS: negative ENT ROS: negative Allergy and Immunology ROS: negative Hematological and Lymphatic ROS: negative Endocrine ROS: negative Breast ROS: negative Respiratory ROS: no cough, shortness of breath, or wheezing Cardiovascular ROS: no chest pain or dyspnea on exertion Gastrointestinal ROS: no abdominal pain,  change in bowel habits, or black or bloody stools Genito-Urinary ROS: no dysuria, trouble voiding, or hematuria Musculoskeletal ROS: negative Neurological ROS: no TIA or stroke symptoms Dermatological ROS: Rash over her right foot  Physical Examination  Vitals:   02/23/22 1230 02/23/22 1300 02/23/22 1307 02/23/22 1330  BP: 123/77 111/70 111/70 120/81  Pulse: 68 68 68 73  Resp: 16 16 16 16   Temp:   98.3 F (36.8 C)   TempSrc:   Oral   SpO2: 99% 98% 98% 100%   Weight:      Height:        BP 120/81   Pulse 73   Temp 98.3 F (36.8 C) (Oral)   Resp 16   Ht 5\' 9"  (1.753 m)   Wt 106.6 kg   SpO2 100%   BMI 34.70 kg/m   General appearance: alert, cooperative, appears stated age, and no distress Head: Normocephalic, without obvious abnormality, atraumatic Throat: lips, mucosa, and tongue normal; teeth and gums normal Neck: no adenopathy, no carotid bruit, no JVD, supple, symmetrical, trachea midline, and thyroid not enlarged, symmetric, no tenderness/mass/nodules Resp: clear to auscultation bilaterally Cardio: regular rate and rhythm, S1, S2 normal, no murmur, click, rub or gallop GI: soft, non-tender; bowel sounds normal; no masses,  no organomegaly Extremities: Erythema noted over the dorsal aspect of the right foot.  Some warmth is present.  Good pulses.  There is also mild erythema over the dorsal aspect of the left foot.  There is an open wound over the dorsal aspect of the right foot. Pulses: 2+ and symmetric Skin: Skin color, texture, turgor normal. No rashes or lesions Lymph nodes: Cervical, supraclavicular, and axillary nodes normal. Neurologic: No obvious focal neurological deficits noted.  Cranial nerves II to XII intact.  She is alert and oriented x3.    Labs on Admission: I have personally reviewed following labs and imaging studies  CBC: Recent Labs  Lab 02/23/22 1218  WBC 5.3  HGB 12.3  HCT 37.1  MCV 83.7  PLT 254   Basic Metabolic Panel: Recent Labs  Lab 02/23/22 1218  NA 141  K 3.6  CL 105  CO2 27  GLUCOSE 113*  BUN 11  CREATININE 0.70  CALCIUM 9.6   GFR: Estimated Creatinine Clearance: 130.1 mL/min (by C-G formula based on SCr of 0.7 mg/dL). Liver Function Tests: Recent Labs  Lab 02/23/22 1218  AST 16  ALT 15  ALKPHOS 41  BILITOT 0.6  PROT 8.3*  ALBUMIN 3.9    Radiological Exams on Admission: No results found.    Problem List  Principal Problem:   Cellulitis of right foot Active  Problems:   Heroin use disorder, moderate (HCC)   Bipolar disorder (HCC)   Assessment: This is 31 year old Caucasian female with past medical history of opioid use disorder who tried to inject herself into the right foot with heroin last week.  She comes in with right foot cellulitis.  Plan:  Right foot cellulitis as a result of IV drug use: She was on clindamycin and doxycycline.  Based on history provided by the patient it looks like the doxycycline did help control the infection.  She mentions that the redness has stopped spreading.  We will continue doxycycline for now.  We will continue with ceftriaxone which was given in the emergency department.  She was also given vancomycin.  WBC is normal.  She is afebrile.  We will get blood cultures.  She was counseled regarding IV drug use.  She mentions  that she will not  use anymore.  She was also seen by orthopedics, Dr. Odis Hollingshead with [facility].  He recommended MRI of the right foot which has been ordered and is pending.  Opioid use disorder: Mentions that she takes 80 mg of methadone on a daily basis.  She gets this from the [facility] which is a methadone clinic in [facility]  I tried calling them but they are closed today.  She will be prescribed methadone dose today and this will need to be verified tomorrow. UDS.  Bipolar disorder: Does not exactly know the doses of the medications she takes.  Will await for medication reconciliation.  Mild hyperglycemia: We will check fasting level tomorrow morning.    DVT Prophylaxis: Lovenox Code Status: Full code Family Communication: Discussed with patient Disposition: Hopefully return home in improved Consults called: None Admission Status: Status is: Observation The patient remains OBS appropriate and will d/c before 2 midnights.    Severity of Illness: The appropriate patient status for this patient is OBSERVATION. Observation status is  judged to be reasonable and necessary in order to provide the required intensity of service to ensure the patient's safety. The patient's presenting symptoms, physical exam findings, and initial radiographic and laboratory data in the context of their medical condition is felt to place them at decreased risk for further clinical deterioration. Furthermore, it is anticipated that the patient will be medically stable for discharge from the hospital within 2 midnights of admission.    Further management decisions will depend on results of further testing and patient's response to treatment.   Sereena Marando [employer]  [facility] [employer] on [email or web address]  02/23/2022, 1:47 PM

## 2022-02-23 NOTE — Progress Notes (Signed)
Went to do pt's nursing admission history. Visitor reports pt is at [facility]. Briscoe Burns BSN, RN-BC Admissions RN 02/23/2022 2:13 PM

## 2022-02-23 NOTE — Progress Notes (Signed)
A consult was received from an ED physician for Vancomycin per pharmacy dosing.  The patient's profile has been reviewed for ht/wt/allergies/indication/available labs.   A one time order has been placed for Vancomycin 2g.  Further antibiotics/pharmacy consults should be ordered by admitting physician if indicated.                       Thank you,  Lynann Beaver PharmD, BCPS Clinical Pharmacist [facility] main pharmacy [phone] 02/23/2022 11:48 AM

## 2022-02-23 NOTE — ED Provider Notes (Signed)
[facility]-EMERGENCY DEPT Provider Note   CSN: [id number] Arrival date & time: 02/23/22  1057     History  Chief Complaint  Patient presents with   Cellulitis    Christina Arnold is a 31 y.o. female.  HPI 31 year old female presents today with worsening right foot infection has failed outpatient therapy.  She states that she shot up in this foot last Friday.  She began having some redness and symptoms the next day.  She was seen at [facility] on Sunday and started on clindamycin and an I&D was performed.  Symptoms continue to worsen she was seen in urgent care Tuesday started on doxycycline.  She was seen at Ortho yesterday and today.  She was sent to the ED for IV antibiotics and further evaluation.  She was seen by Dr. Odis Hollingshead.  She denies fever, chills, or significant pain.  There is some spreading redness.      Home Medications Prior to Admission medications   Medication Sig Start Date End Date Taking? Authorizing Provider  aluminum-magnesium hydroxide 200-200 MG/5ML suspension Take 10 mLs by mouth every 6 (six) hours as needed for indigestion. 10/26/14   Oswaldo Conroy, PA-C  azithromycin (ZITHROMAX Z-PAK) 250 MG tablet Take 1 tablet (250 mg total) by mouth daily. 09/05/15   Elpidio Anis, PA-C  clindamycin (CLEOCIN) 150 MG capsule Take 3 capsules (450 mg total) by mouth 3 (three) times daily for 5 days. 02/19/22 02/24/22  Claude Manges, PA-C  cloNIDine (CATAPRES) 0.1 MG tablet Take 2 tablets (0.2 mg total) by mouth 2 (two) times daily. 09/25/15   Melton Krebs, PA-C  ibuprofen (ADVIL,MOTRIN) 800 MG tablet Take 1 tablet (800 mg total) by mouth 3 (three) times daily. 09/05/15   Elpidio Anis, PA-C  promethazine (PHENERGAN) 25 MG tablet Take 1 tablet (25 mg total) by mouth every 6 (six) hours as needed for nausea or vomiting. 09/25/15   Melton Krebs, PA-C      Allergies    Amoxicillin    Review of Systems   Review of  Systems  Physical Exam Updated Vital Signs BP 120/81   Pulse 73   Temp 98.3 F (36.8 C) (Oral)   Resp 16   Ht 1.753 m (5\' 9" )   Wt 106.6 kg   SpO2 100%   BMI 34.70 kg/m  Physical Exam Vitals and nursing note reviewed.  Constitutional:      Appearance: Normal appearance.  HENT:     Head: Normocephalic.     Right Ear: External ear normal.     Left Ear: External ear normal.     Nose: Nose normal.     Mouth/Throat:     Pharynx: Oropharynx is clear.  Eyes:     Pupils: Pupils are equal, round, and reactive to light.  Cardiovascular:     Rate and Rhythm: Normal rate.  Pulmonary:     Effort: Pulmonary effort is normal.  Musculoskeletal:        General: Normal range of motion.     Cervical back: Normal range of motion.  Skin:    General: Skin is warm.     Comments: Right foot with draining area in place with erythema from toes to ankle with some surrounding redness towards heel There appears to be no proximal involvement Pulses and sensation are intact  Neurological:     Mental Status: She is alert.    ED Results / Procedures / Treatments   Labs (all labs ordered are listed,  but only abnormal results are displayed) Labs Reviewed  COMPREHENSIVE METABOLIC PANEL - Abnormal; Notable for the following components:      Result Value   Glucose, Bld 113 (*)    Total Protein 8.3 (*)    All other components within normal limits  CBC  C-REACTIVE PROTEIN  SEDIMENTATION RATE  I-STAT BETA HCG BLOOD, ED ([facility], [facility], [facility] ONLY)    EKG None  Radiology No results found.  Procedures Procedures    Medications Ordered in ED Medications  vancomycin (VANCOREADY) IVPB 2000 mg/400 mL (2,000 mg Intravenous New Bag/Given 02/23/22 1243)  cefTRIAXone (ROCEPHIN) 2 g in sodium chloride 0.9 % 100 mL IVPB (0 g Intravenous Stopped 02/23/22 1243)    ED Course/ Medical Decision Making/ A&P                           Medical Decision Making 31 year old female with IV drug abuse presents today  with infection of right foot after shooting up last Friday.  Patient has been seen and evaluated 4 times in the room.  She has been on multiple antibiotics.  She was again seen by orthopedics today.  She has failed outpatient therapy requires IV drug therapy. Care discussed with Dr. Odis Hollingshead.  He will review MRI. Plan admission for IV antibiotics for current cellulitis which has failed outpatient therapy If MRI is positive for osteomyelitis, patient may need further evaluation by orthopedics If positive for osteomyelitis, will need ID consult and ongoing to biotics Care discussed with Dr. Rito Ehrlich who will see for admission  Amount and/or Complexity of Data Reviewed External Data Reviewed: labs. Labs: ordered. Decision-making details documented in ED Course. Radiology: ordered.  Risk Prescription drug management.           Final Clinical Impression(s) / ED Diagnoses Final diagnoses:  Cellulitis of right lower extremity    Rx / DC Orders ED Discharge Orders     None         Margarita Grizzle, MD 02/23/22 1338

## 2022-02-23 NOTE — Consult Note (Signed)
Patient ID: Christina Arnold MRN: [id number] DOB/AGE: 33-Dec-1992 33 y.o.  Admit date: 02/23/2022  Admission Diagnoses:  Principal Problem:   Cellulitis of right foot Active Problems:   Heroin use disorder, moderate (HCC)   Bipolar disorder (HCC)   HPI: Ortho consult for right foot infection s/p local self-injection of heroin by patient on 02/17/22. The patient was seen earlier by me today in clinic as a referral from our urgent care team. The patient had been seen in ER as well previously and placed on antibiotics. She reports her right foot was normal prior to her heroin use last Friday. She had fever/chills with purulent drainage from this right foot for the past week. However, the patient reports she incised the area at home to drain the pus and since then, she has noticed significant improvement in swelling and erythema. She has been on PO antibiotics. Her PMH is notable for bipolar type 1 disorder and depression apart from known IVDU. She denies numbness/tingling. Due to concern for sepsis, the patient was sent to the ER for urgent labwork and advanced imaging. She is now admitted to [facility] Medicine team.  Past Medical History: Past Medical History:  Diagnosis Date   ADHD    Anxiety    Bipolar 1 disorder [facility](HCC)    Depression     Surgical History: Past Surgical History:  Procedure Laterality Date   DILATION AND CURETTAGE OF UTERUS      Family History: History reviewed. No pertinent family history.  Social History: Social History   Socioeconomic History   Marital status: Single    Spouse name: Not on file   Number of children: Not on file   Years of education: Not on file   Highest education level: Not on file  Occupational History   Not on file  Tobacco Use   Smoking status: Never   Smokeless tobacco: Not on file  Vaping Use   Vaping Use: Never used  Substance and Sexual Activity   Alcohol use: Not Currently   Drug use: Yes    Types: IV    Comment:  Heroin use   Sexual activity: Yes    Birth control/protection: None  Other Topics Concern   Not on file  Social History Narrative   Not on file   Social Determinants of Health   Financial Resource Strain: Not on file  Food Insecurity: Not on file  Transportation Needs: Not on file  Physical Activity: Not on file  Stress: Not on file  Social Connections: Not on file  Intimate Partner Violence: Not on file    Allergies: Vancomycin and Amoxicillin  Medications: I have reviewed the patient's current medications.   Vital Signs: Patient Vitals for the past 24 hrs:  BP Temp Temp src Pulse Resp SpO2 Height Weight  02/23/22 1525 114/79 97.8 F (36.6 C) Oral 67 16 99 % -- --  02/23/22 1330 120/81 -- -- 73 16 100 % -- --  02/23/22 1307 111/70 98.3 F (36.8 C) Oral 68 16 98 % -- --  02/23/22 1300 111/70 -- -- 68 16 98 % -- --  02/23/22 1230 123/77 -- -- 68 16 99 % -- --  02/23/22 1204 116/75 98.3 F (36.8 C) Oral 63 16 98 % -- --  02/23/22 1105 136/79 98.3 F (36.8 C) Oral 80 18 100 % 5\' 9"  (1.753 m) 106.6 kg    Radiology: MR FOOT RIGHT W WO CONTRAST  Result Date: 02/23/2022 CLINICAL DATA:  Soft tissue infection  suspected, ankle, xray done; Soft tissue mass, foot, deep EXAM: MRI OF THE RIGHT ANKLE WITHOUT AND WITH CONTRAST; MRI OF THE RIGHT FOREFOOT WITHOUT AND WITH CONTRAST TECHNIQUE: Multiplanar, multisequence MR imaging of the right ankle and foot was performed before and after the administration of intravenous contrast. CONTRAST:  5mL GADAVIST GADOBUTROL 1 MMOL/ML IV SOLN COMPARISON:  Foot radiograph 02/19/2022 FINDINGS: MRI ANKLE TENDONS Peroneal: There is a long segment split peroneus brevis tear at and distal to the lateral malleolus with tendinosis and partial tearing distally through its attachment on the base of the fifth metatarsal. Intact peroneal longus tendon. Posteromedial: Mild tenosynovitis of the inframalleolar posterior tibial tendon. No PT tendon tear. Intact  flexor hallucis longus and flexor digitorum longus tendons. Anterior: Intact tibialis anterior, extensor hallucis longus and extensor digitorum longus tendons. Achilles: Mild peritendinitis of the distal Achilles tendon. No Achilles tendon tear. Mild adjacent edema in Kager's fat pad. Plantar Fascia: Intact. LIGAMENTS Lateral: Anterior talofibular ligament intact. Calcaneofibular ligament intact. Posterior talofibular ligament intact. Anterior and posterior tibiofibular ligaments intact. Medial: Deltoid ligament intact. Spring ligament intact. CARTILAGE Ankle Joint: No significant joint effusion. Normal ankle mortise. No chondral defect. Subtalar Joints/Sinus Tarsi: Normal subtalar joints. No significant subtalar joint effusion. Normal sinus tarsi. 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 There is no other significant marrow signal alteration. Soft Tissue: There is generalized soft tissue swelling there is a dorsal medial midfoot wound with adjacent soft tissue swelling and enhancement. There is no focal/drainable fluid collection. MRI FOOT Bones/Joint/Cartilage No evidence of fracture. 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 No other significant marrow signal alteration in the midfoot or forefoot. Ligaments Intact MTP collateral ligaments. No evidence of plantar plate tear. Intact Lisfranc ligament. Muscles and Tendons No significant muscle edema or muscle atrophy. No acute tendon tear. Soft tissue There is dorsal soft tissue swelling. There is a dorsal medial midfoot wound with adjacent soft tissue swelling and enhancement but no well-defined/drainable collection. No evidence of intermetatarsal neuroma. There is mild intermetatarsal bursitis in the third webspace between the third and fourth metatarsal heads. IMPRESSION: IMPRESSION Dorsal medial midfoot wound with adjacent soft tissue swelling and  enhancement. No evidence of soft tissue abscess or osteomyelitis. Ankle and dorsal foot soft tissue swelling, can be seen in cellulitis. Long segment split peroneus brevis tear at and distal to the lateral malleolus with tendinosis and partial tearing distally through its attachment on the base of the fifth metatarsal. Mild tenosynovitis of the inframalleolar posterior tibial tendon, likely reactive. 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 Electronically Signed   By: Caprice Renshaw M.D.   On: 02/23/2022 15:28   MR ANKLE RIGHT W WO CONTRAST  Result Date: 02/23/2022 CLINICAL DATA:  Soft tissue infection suspected, ankle, xray done; Soft tissue mass, foot, deep EXAM: MRI OF THE RIGHT ANKLE WITHOUT AND WITH CONTRAST; MRI OF THE RIGHT FOREFOOT WITHOUT AND WITH CONTRAST TECHNIQUE: Multiplanar, multisequence MR imaging of the right ankle and foot was performed before and after the administration of intravenous contrast. CONTRAST:  45mL GADAVIST GADOBUTROL 1 MMOL/ML IV SOLN COMPARISON:  Foot radiograph 02/19/2022 FINDINGS: MRI ANKLE TENDONS Peroneal: There is a long segment split peroneus brevis tear at and distal to the lateral malleolus with tendinosis and partial tearing distally through its attachment on the base of the fifth metatarsal. Intact peroneal longus tendon. Posteromedial: Mild tenosynovitis of the inframalleolar posterior tibial tendon. No PT tendon tear. Intact flexor hallucis longus and flexor digitorum  longus tendons. Anterior: Intact tibialis anterior, extensor hallucis longus and extensor digitorum longus tendons. Achilles: Mild peritendinitis of the distal Achilles tendon. No Achilles tendon tear. Mild adjacent edema in Kager's fat pad. Plantar Fascia: Intact. LIGAMENTS Lateral: Anterior talofibular ligament intact. Calcaneofibular ligament intact. Posterior talofibular ligament intact. Anterior and posterior tibiofibular ligaments intact. Medial: Deltoid ligament intact.  Spring ligament intact. CARTILAGE Ankle Joint: No significant joint effusion. Normal ankle mortise. No chondral defect. Subtalar Joints/Sinus Tarsi: Normal subtalar joints. No significant subtalar joint effusion. Normal sinus tarsi. 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 There is no other significant marrow signal alteration. Soft Tissue: There is generalized soft tissue swelling there is a dorsal medial midfoot wound with adjacent soft tissue swelling and enhancement. There is no focal/drainable fluid collection. MRI FOOT Bones/Joint/Cartilage No evidence of fracture. 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 No other significant marrow signal alteration in the midfoot or forefoot. Ligaments Intact MTP collateral ligaments. No evidence of plantar plate tear. Intact Lisfranc ligament. Muscles and Tendons No significant muscle edema or muscle atrophy. No acute tendon tear. Soft tissue There is dorsal soft tissue swelling. There is a dorsal medial midfoot wound with adjacent soft tissue swelling and enhancement but no well-defined/drainable collection. No evidence of intermetatarsal neuroma. There is mild intermetatarsal bursitis in the third webspace between the third and fourth metatarsal heads. IMPRESSION: IMPRESSION Dorsal medial midfoot wound with adjacent soft tissue swelling and enhancement. No evidence of soft tissue abscess or osteomyelitis. Ankle and dorsal foot soft tissue swelling, can be seen in cellulitis. Long segment split peroneus brevis tear at and distal to the lateral malleolus with tendinosis and partial tearing distally through its attachment on the base of the fifth metatarsal. Mild tenosynovitis of the inframalleolar posterior tibial tendon, likely reactive. 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 Electronically Signed   By: Caprice Renshaw M.D.   On:  02/23/2022 15:28   DG Foot Complete Right  Result Date: 02/19/2022 CLINICAL DATA:  Right foot pain and redness EXAM: RIGHT FOOT COMPLETE - 3+ VIEW COMPARISON:  None Available. FINDINGS: There is no evidence of fracture or dislocation. No erosion or periosteal elevation. There is no evidence of arthropathy or other focal bone abnormality. Diffuse soft tissue swelling. No soft tissue gas. IMPRESSION: 1. No acute osseous abnormality. No radiographic evidence of osteomyelitis. 2. Diffuse soft tissue swelling. No soft tissue gas. Electronically Signed   By: Duanne Guess D.O.   On: 02/19/2022 19:14    Labs: Recent Labs    02/23/22 1218  WBC 5.3  RBC 4.43  HCT 37.1  PLT 254   Recent Labs    02/23/22 1218  NA 141  K 3.6  CL 105  CO2 27  BUN 11  CREATININE 0.70  GLUCOSE 113*  CALCIUM 9.6   No results for input(s): LABPT, INR in the last 72 hours.  Review of Systems: ROS as detailed in the HPI  Physical Exam: Body mass index is 34.7 kg/m.  Physical Exam  Gen: AAOx3, NAD Comfortable at rest  Right Lower Extremity: 1x1x1 cm dorsomedial wound with surrounding erythema and minimal induration, frank purulence noted (expressed till only serous drainage remains clinically) Skin intact otherwise TTP in region surrounding aforementioned wound HF/KE/KF/ADF/APF/EHL 5/5 SILT throughout DP, PT 2+ to palp CR < 2s   Assessment and Plan: Ortho consult for right foot cellulitis x 7 days following heroin local injection on 02/17/22 in setting of PMH bipolar type 1  and depression  -history, exam and imaging reviewed at length with the patient from during clinic visit; MRI since admission also reviewed -she was referred to the ER directly from clinic to rule out sepsis and/or deep abscess/osteomyelitis requiring surgical intervention -MRI of the right foot and ankle with and without contrast does not show a drainable fluid collection or osteomyelitis but does confirm extensive  cellulitis -no acute surgical intervention at this time -recommend broad spectrum IV abx, trend swab cultures -abx and VTE ppx per primary team -recommend daily dry dressing changes to the right foot -Ortho will continue to follow   Netta Cedars, MD Orthopaedic Surgeon [facility] [phone]

## 2022-02-24 LAB — BASIC METABOLIC PANEL
Anion gap: 8 (ref 5–15)
BUN: 6 mg/dL (ref 6–20)
CO2: 24 mmol/L (ref 22–32)
Calcium: 9.2 mg/dL (ref 8.9–10.3)
Chloride: 110 mmol/L (ref 98–111)
Creatinine, Ser: 0.7 mg/dL (ref 0.44–1.00)
GFR, Estimated: >60 mL/min (ref >60)
Glucose, Bld: 119 mg/dL — ABNORMAL HIGH (ref 70–99)
Potassium: 3.4 mmol/L — ABNORMAL LOW (ref 3.5–5.1)
Sodium: 142 mmol/L (ref 135–145)

## 2022-02-24 LAB — CBC
HCT: 42 % (ref 36.0–46.0)
Hemoglobin: 13.3 g/dL (ref 12.0–15.0)
MCH: 27.7 pg (ref 26.0–34.0)
MCHC: 31.7 g/dL (ref 30.0–36.0)
MCV: 87.3 fL (ref 80.0–100.0)
Platelets: UNDETERMINED 10*3/uL (ref 150–400)
RBC: 4.81 MIL/uL (ref 3.87–5.11)
RDW: 13.9 % (ref 11.5–15.5)
WBC: 3.8 10*3/uL — ABNORMAL LOW (ref 4.0–10.5)
nRBC: 0 % (ref 0.0–0.2)

## 2022-02-24 LAB — HIV ANTIBODY (ROUTINE TESTING W REFLEX): HIV Screen 4th Generation wRfx: NONREACTIVE

## 2022-02-24 MED ORDER — TRIPLE ANTIBIOTIC 3.5-400-5000 EX OINT: TOPICAL_OINTMENT | CUTANEOUS | 0 refills | Status: AC

## 2022-02-24 MED ORDER — DOXYCYCLINE HYCLATE 100 MG PO TABS: 100.0000 mg | ORAL_TABLET | Freq: Two times a day (BID) | ORAL | 0 refills | Status: AC

## 2022-02-24 MED ORDER — CEFPODOXIME PROXETIL 200 MG PO TABS: 200.0000 mg | ORAL_TABLET | Freq: Two times a day (BID) | ORAL | 0 refills | Status: AC

## 2022-02-24 NOTE — Progress Notes (Signed)
Explained AMA/risks associated, patient verbalized understanding

## 2022-02-24 NOTE — Progress Notes (Signed)
Patient requesting to leave AMA. Explained the risks associated with leaving, patient still wanting to go. Contacted physician, removed IV, patient signed AMA form. Patient dressed and assisted downstairs.

## 2022-02-24 NOTE — Discharge Summary (Signed)
[facility]  Physician Discharge Summary   Patient ID: Christina Arnold MRN: [id number] DOB/AGE: March 22, 1991 31 y.o.  Admit date: 02/23/2022 Discharge date: 02/24/2022    PCP: Pcp, No  DISCHARGE DIAGNOSES:  Principal Problem:   Cellulitis of right foot Active Problems:   Heroin use disorder, moderate (HCC)   Bipolar disorder (HCC)   RECOMMENDATIONS FOR OUTPATIENT FOLLOW UP: PATIENT LEFT AGAINST MEDICAL ADVICE    INITIAL HISTORY: 31 y.o. 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  This was followed by onset of redness in that area which spread over the following days.  She went to emergency department on 5/21 and she was prescribed clindamycin at that time.  She also underwent I&D at that time.  She did not notice any improvement so she went to an urgent care center a few days ago and was prescribed doxycycline.  She was referred to orthopedic surgeon.  She was seen by Dr. Odis Hollingshead at [facility].  He sent her to the emergency department for IV antibiotics hospitalization and MRI of the right foot.  Patient currently denies any significant pain in the right foot.  The redness has not spread in the last 2 days.  Has been warm to touch but that is also improved.  Denies any nausea vomiting.  No fever or chills.  MRI foot was ordered.  Blood work was done in the emergency department.  She was given vancomycin and ceftriaxone.  Consultations: Dr. Odis Hollingshead with orthopedics  Procedures: None   HOSPITAL COURSE:   Patient was hospitalized for IV antibiotics for her right foot cellulitis.  Blood cultures were sent.  MRI of the foot and ankle suggested cellulitis without any evidence for abscess or osteomyelitis.  Reactive tenosynovitis was noted.  Seen by orthopedics.  No role for surgical  intervention.  Patient was placed on ceftriaxone and doxycycline.  Erythema is much improved this morning.  She does have a wound on the dorsal aspect of the right foot which was also evaluated by the orthopedic surgeon.  The wound is open and was draining yellowish exudate.  Patient's erythema significantly improved this morning.  She wanted to go home.  Told her that WE will need to discuss with orthopedics first.  She did not want to wait and decided to leave AGAINST MEDICAL ADVICE.  Prescriptions were sent for doxycycline and cefpodoxime.  She does have opioid use disorder and is on methadone maintenance.  She gets her medications from the [facility] which is a methadone clinic in [facility].  Obesity Estimated body mass index is 34.7 kg/m as calculated from the following:   Height as of this encounter:  (1.753 m).   Weight as of this encounter: 106.6 kg.  PATIENT LEFT AGAINST MEDICAL ADVICE  PERTINENT LABS:  The results of significant diagnostics from this hospitalization (including imaging, microbiology, ancillary and laboratory) are listed below for reference.    Microbiology: Recent Results (from the past 240 hour(s))  Culture, blood (Routine X 2) w Reflex to ID Panel     Status: None (Preliminary result)   Collection Time: 02/23/22  5:25 PM   Specimen: BLOOD  Result Value Ref Range Status   Specimen Description   Final    BLOOD BLOOD LEFT FOREARM Performed at [facility], [address] [place],  [place] [address]    Special Requests   Final    IN PEDIATRIC BOTTLE Blood Culture adequate volume Performed at [facility], [address] [place], [place] [address]    Culture   Final    NO GROWTH < 12 HOURS Performed at [facility], [address], [place], [place] [address]    Report Status PENDING  Incomplete  Culture, blood (Routine X 2) w Reflex to ID Panel     Status: None  (Preliminary result)   Collection Time: 02/23/22  5:25 PM   Specimen: BLOOD  Result Value Ref Range Status   Specimen Description   Final    BLOOD BLOOD LEFT HAND Performed at [facility], [address], [place], [place] [address]    Special Requests   Final    IN PEDIATRIC BOTTLE Blood Culture adequate volume Performed at [facility], [address] [place], [place] [address]    Culture   Final    NO GROWTH < 12 HOURS Performed at [facility], [address] [place], [place] [address]    Report Status PENDING  Incomplete     Labs:   Basic Metabolic Panel: Recent Labs  Lab 02/23/22 1218 02/24/22 0835  NA 141 142  K 3.6 3.4*  CL 105 110  CO2 27 24  GLUCOSE 113* 119*  BUN 11 6  CREATININE 0.70 0.70  CALCIUM 9.6 9.2   Liver Function Tests: Recent Labs  Lab 02/23/22 1218  AST 16  ALT 15  ALKPHOS 41  BILITOT 0.6  PROT 8.3*  ALBUMIN 3.9    CBC: Recent Labs  Lab 02/23/22 1218 02/24/22 0835  WBC 5.3 3.8*  HGB 12.3 13.3  HCT 37.1 42.0  MCV 83.7 87.3  PLT 254 PLATELET CLUMPS NOTED ON SMEAR, UNABLE TO ESTIMATE     IMAGING STUDIES MR FOOT RIGHT W WO CONTRAST  Result Date: 02/23/2022 CLINICAL DATA:  Soft tissue infection suspected, ankle, xray done; Soft tissue mass, foot, deep EXAM: MRI OF THE RIGHT ANKLE WITHOUT AND WITH CONTRAST; MRI OF THE RIGHT FOREFOOT WITHOUT AND WITH CONTRAST TECHNIQUE: Multiplanar, multisequence MR imaging of the right ankle and foot was performed before and after the administration of intravenous contrast. CONTRAST:  23mL GADAVIST GADOBUTROL 1 MMOL/ML IV SOLN COMPARISON:  Foot radiograph 02/19/2022 FINDINGS: MRI ANKLE TENDONS Peroneal: There is a long segment split peroneus brevis tear at and distal to the lateral malleolus with tendinosis and partial tearing distally through its attachment on the base of the fifth metatarsal. Intact peroneal longus tendon. Posteromedial: Mild tenosynovitis  of the inframalleolar posterior tibial tendon. No PT tendon tear. Intact flexor hallucis longus and flexor digitorum longus tendons. Anterior: Intact tibialis anterior, extensor hallucis longus and extensor digitorum longus tendons. Achilles: Mild peritendinitis of the distal Achilles tendon. No Achilles tendon tear. Mild adjacent edema in Kager's fat pad. Plantar Fascia: Intact. LIGAMENTS Lateral: Anterior talofibular ligament intact. Calcaneofibular ligament intact. Posterior talofibular ligament intact. Anterior and posterior tibiofibular ligaments intact. Medial: Deltoid ligament intact. Spring ligament intact. CARTILAGE Ankle Joint: No significant joint effusion. Normal ankle mortise. No chondral defect. Subtalar Joints/Sinus Tarsi: Normal subtalar joints. No significant subtalar joint effusion. Normal sinus tarsi. 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 There is no other significant marrow signal alteration. Soft Tissue: There is generalized soft tissue swelling there is a dorsal medial midfoot wound with adjacent soft tissue swelling and enhancement. There is  no focal/drainable fluid collection. MRI FOOT Bones/Joint/Cartilage No evidence of fracture. 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 No other significant marrow signal alteration in the midfoot or forefoot. Ligaments Intact MTP collateral ligaments. No evidence of plantar plate tear. Intact Lisfranc ligament. Muscles and Tendons No significant muscle edema or muscle atrophy. No acute tendon tear. Soft tissue There is dorsal soft tissue swelling. There is a dorsal medial midfoot wound with adjacent soft tissue swelling and enhancement but no well-defined/drainable collection. No evidence of intermetatarsal neuroma. There is mild intermetatarsal bursitis in the third webspace between the third and fourth metatarsal heads. IMPRESSION:  IMPRESSION Dorsal medial midfoot wound with adjacent soft tissue swelling and enhancement. No evidence of soft tissue abscess or osteomyelitis. Ankle and dorsal foot soft tissue swelling, can be seen in cellulitis. Long segment split peroneus brevis tear at and distal to the lateral malleolus with tendinosis and partial tearing distally through its attachment on the base of the fifth metatarsal. Mild tenosynovitis of the inframalleolar posterior tibial tendon, likely reactive. 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 Electronically Signed   By: Caprice Renshaw M.D.   On: 02/23/2022 15:28   MR ANKLE RIGHT W WO CONTRAST  Result Date: 02/23/2022 CLINICAL DATA:  Soft tissue infection suspected, ankle, xray done; Soft tissue mass, foot, deep EXAM: MRI OF THE RIGHT ANKLE WITHOUT AND WITH CONTRAST; MRI OF THE RIGHT FOREFOOT WITHOUT AND WITH CONTRAST TECHNIQUE: Multiplanar, multisequence MR imaging of the right ankle and foot was performed before and after the administration of intravenous contrast. CONTRAST:  1mL GADAVIST GADOBUTROL 1 MMOL/ML IV SOLN COMPARISON:  Foot radiograph 02/19/2022 FINDINGS: MRI ANKLE TENDONS Peroneal: There is a long segment split peroneus brevis tear at and distal to the lateral malleolus with tendinosis and partial tearing distally through its attachment on the base of the fifth metatarsal. Intact peroneal longus tendon. Posteromedial: Mild tenosynovitis of the inframalleolar posterior tibial tendon. No PT tendon tear. Intact flexor hallucis longus and flexor digitorum longus tendons. Anterior: Intact tibialis anterior, extensor hallucis longus and extensor digitorum longus tendons. Achilles: Mild peritendinitis of the distal Achilles tendon. No Achilles tendon tear. Mild adjacent edema in Kager's fat pad. Plantar Fascia: Intact. LIGAMENTS Lateral: Anterior talofibular ligament intact. Calcaneofibular ligament intact. Posterior talofibular ligament intact. Anterior  and posterior tibiofibular ligaments intact. Medial: Deltoid ligament intact. Spring ligament intact. CARTILAGE Ankle Joint: No significant joint effusion. Normal ankle mortise. No chondral defect. Subtalar Joints/Sinus Tarsi: Normal subtalar joints. No significant subtalar joint effusion. Normal sinus tarsi. 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 There is no other significant marrow signal alteration. Soft Tissue: There is generalized soft tissue swelling there is a dorsal medial midfoot wound with adjacent soft tissue swelling and enhancement. There is no focal/drainable fluid collection. MRI FOOT Bones/Joint/Cartilage No evidence of fracture. 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 No other significant marrow signal alteration in the midfoot or forefoot. Ligaments Intact MTP collateral ligaments. No evidence of plantar plate tear. Intact Lisfranc ligament. Muscles and Tendons No significant muscle edema or muscle atrophy. No acute tendon tear. Soft tissue There is dorsal soft tissue swelling. There is a dorsal medial midfoot wound with adjacent soft tissue swelling and enhancement but no well-defined/drainable collection. No evidence of intermetatarsal neuroma. There is mild intermetatarsal bursitis in the third webspace between the third and fourth metatarsal heads. IMPRESSION: IMPRESSION Dorsal medial midfoot wound with adjacent soft tissue swelling and enhancement. No evidence of soft tissue  abscess or osteomyelitis. Ankle and dorsal foot soft tissue swelling, can be seen in cellulitis. Long segment split peroneus brevis tear at and distal to the lateral malleolus with tendinosis and partial tearing distally through its attachment on the base of the fifth metatarsal. Mild tenosynovitis of the inframalleolar posterior tibial tendon, likely reactive. Mild calcaneocuboid and navicular-lateral  cuneiform osteoarthritis. Electronically Signed   By: Caprice RenshawJacob  Kahn M.D.   On: 02/23/2022 15:28   DG Foot Complete Right  Result Date: 02/19/2022 CLINICAL DATA:  Right foot pain and redness EXAM: RIGHT FOOT COMPLETE - 3+ VIEW COMPARISON:  None Available. FINDINGS: There is no evidence of fracture or dislocation. No erosion or periosteal elevation. There is no evidence of arthropathy or other focal bone abnormality. Diffuse soft tissue swelling. No soft tissue gas. IMPRESSION: 1. No acute osseous abnormality. No radiographic evidence of osteomyelitis. 2. Diffuse soft tissue swelling. No soft tissue gas. Electronically Signed   By: Duanne GuessNicholas  Plundo D.O.   On: 02/19/2022 19:14    DISCHARGE EXAMINATION: Vitals:   02/23/22 1525 02/23/22 2148 02/24/22 0150 02/24/22 0603  BP: 114/79 107/70 107/68 109/77  Pulse: 67 (!) 57 66 (!) 55  Resp: 16 16 16 16   Temp: 97.8 F (36.6 C) 98.1 F (36.7 C) 98 F (36.7 C) 98.6 F (37 C)  TempSrc: Oral Oral Oral Oral  SpO2: 99% 99% 99% 100%  Weight:      Height:       General appearance: Awake alert.  In no distress Resp: Clear to auscultation bilaterally.  Normal effort Cardio: S1-S2 is normal regular.  No S3-S4.  No rubs murmurs or bruit GI: Abdomen is soft.  Nontender nondistended.  Bowel sounds are present normal.  No masses organomegaly Extremities: Significantly improved erythema of the right foot.  Wound noted over the dorsal aspect.  No significant drainage noted this morning. Neurologic: Alert and oriented x3.  No focal neurological deficits.    DISPOSITION: PATIENT LEFT AGAINST MEDICAL ADVICE     Allergies as of 02/24/2022       Reactions   Vancomycin Itching, Rash   Red man syndrome?   Amoxicillin Hives        Medication List     STOP taking these medications    aluminum-magnesium hydroxide 200-200 MG/5ML suspension   azithromycin 250 MG tablet Commonly known as: Zithromax Z-Pak   clindamycin 150 MG capsule Commonly known as:  CLEOCIN   cloNIDine 0.1 MG tablet Commonly known as: CATAPRES   promethazine 25 MG tablet Commonly known as: PHENERGAN       TAKE these medications    cefpodoxime 200 MG tablet Commonly known as: VANTIN Take 1 tablet (200 mg total) by mouth 2 (two) times daily for 7 days.   doxycycline 100 MG tablet Commonly known as: VIBRA-TABS Take 1 tablet (100 mg total) by mouth 2 (two) times daily for 7 days.   ibuprofen 800 MG tablet Commonly known as: ADVIL Take 1 tablet (800 mg total) by mouth 3 (three) times daily.   methadone 10 MG/ML solution Commonly known as: DOLOPHINE Take 80 mg by mouth daily.   neomycin-bacitracin-polymyxin [phone] Oint Apply to the wound on the right foot 3 times a day.           TOTAL DISCHARGE TIME: 35 minutes  Saje Gallop [employer] LockerKrishnan  [facility] Pager on [email or web address]  02/24/2022, 11:13 AM

## 2022-02-24 NOTE — Plan of Care (Signed)
  Problem: Education: Goal: Knowledge of General Education information will improve Description: Including pain rating scale, medication(s)/side effects and non-pharmacologic comfort measures Outcome: Progressing   Problem: Activity: Goal: Risk for activity intolerance will decrease Outcome: Progressing   Problem: Pain Managment: Goal: General experience of comfort will improve Outcome: Progressing   

## 2022-02-28 LAB — CULTURE, BLOOD (ROUTINE X 2)
Culture: NO GROWTH
Culture: NO GROWTH
Special Requests: ADEQUATE
Special Requests: ADEQUATE
